# Patient Record
Sex: Female | Born: 1985 | Race: White | Hispanic: No | Marital: Married | Smoking: Current every day smoker
Health system: Southern US, Community
[De-identification: ages and names within clinical notes are randomized; demographics above are authoritative.]

## PROBLEM LIST (undated history)

## (undated) DIAGNOSIS — R102 Pelvic and perineal pain: Secondary | ICD-10-CM

## (undated) DIAGNOSIS — Z87442 Personal history of urinary calculi: Secondary | ICD-10-CM

## (undated) DIAGNOSIS — N83201 Unspecified ovarian cyst, right side: Secondary | ICD-10-CM

## (undated) DIAGNOSIS — N200 Calculus of kidney: Secondary | ICD-10-CM

## (undated) DIAGNOSIS — Z973 Presence of spectacles and contact lenses: Secondary | ICD-10-CM

## (undated) DIAGNOSIS — K219 Gastro-esophageal reflux disease without esophagitis: Secondary | ICD-10-CM

## (undated) HISTORY — PX: APPENDECTOMY: SHX54

---

## 2014-06-27 ENCOUNTER — Encounter (HOSPITAL_COMMUNITY): Payer: Self-pay | Admitting: Emergency Medicine

## 2014-06-27 ENCOUNTER — Emergency Department (INDEPENDENT_AMBULATORY_CARE_PROVIDER_SITE_OTHER)
Admission: EM | Admit: 2014-06-27 | Discharge: 2014-06-27 | Disposition: A | Discharge status: Home / Self Care | Payer: BC Managed Care – PPO | Source: Home / Self Care | Attending: Emergency Medicine | Admitting: Emergency Medicine

## 2014-06-27 DIAGNOSIS — B86 Scabies: Secondary | ICD-10-CM

## 2014-06-27 MED ORDER — PERMETHRIN 5 % EX CREA: TOPICAL_CREAM | CUTANEOUS | Status: DC

## 2014-06-27 MED ORDER — HYDROXYZINE HCL 25 MG PO TABS: 25.0000 mg | ORAL_TABLET | Freq: Three times a day (TID) | ORAL | Status: DC | PRN

## 2014-06-27 MED ORDER — PREDNISONE 10 MG PO TABS: ORAL_TABLET | ORAL | Status: DC

## 2014-06-27 NOTE — Discharge Instructions (Signed)
Scabies is a rash caused by infestation with the mite Sarcoptes scabiei, a 0.3 mm mite that can burrow and deposit eggs in the the surface layer of the skin.  It is transmitted from other infected persons and often the entire family is infested even though they may not have symptoms.  The most common symptom is a very itchy rash.  Scabies can be treated by applying a cream with a medication call Permethrin.  An oral medication called Ivermectin is also available. ° °Here are the the instructions for treatment of scabies.  It is important to remember that all household members should be treated twice.  Once now, and once in 2 weeks. ° °· On the first night, shower, then apply the Elimite cream from head to toe.  This means on the scalp, face, armpits, hands and wrists, under the breasts, the naval, the genital area, the cleft between the buttocks, the feet and between the toes--everywhere on the body.  Do not miss even 1 square inch. °· Leave the cream on overnight, at least 8 hours. °· The next morning, shower again and scrub the Elimite off completely.  Clip the nails short and scrub under the nails with a toothbrush, since the mites can often live under the nails, then be transmitted to other parts of the body.  °· After that, strip the bed and wash sheets, pillow cases, pajamas, underwear and anything that has come in contact with your body in the past month in hot water.   °· Spray your matresses, chairs, couch, and furniture with RID spray which can be gotten over the counter at the drug store. °· Repeat this entire process in 1 week. ° °After the 2 applications, all the mites on your body should be dead.  It will take a while for the itching to go away, sometimes as much as a month.  The skin must rid itself of all the dead mites, eggs, and excreta.  You can use antihistamines such as Benadryl until this happens.  If the itching is severe, cortisone derivatives may help. ° °If the itching persists, it may be  that the rash is caused by something else, that your were reinfected or the Permethrin did not work in which case it would be reasonable to try the oral pill Ivermectin.  If you still have a rash or itching in 1 month, return to the Urgent Care Center or your primary care doctor for a recheck. ° ° °Scabies °Scabies are small bugs (mites) that burrow under the skin and cause red bumps and severe itching. These bugs can only be seen with a microscope. Scabies are highly contagious. They can spread easily from person to person by direct contact. They are also spread through sharing clothing or linens that have the scabies mites living in them. It is not unusual for an entire family to become infected through shared towels, clothing, or bedding.  °HOME CARE INSTRUCTIONS  °· Your caregiver may prescribe a cream or lotion to kill the mites. If cream is prescribed, massage the cream into the entire body from the neck to the bottom of both feet. Also massage the cream into the scalp and face if your child is less than 1 year old. Avoid the eyes and mouth. Do not wash your hands after application. °· Leave the cream on for 8 to 12 hours. Your child should bathe or shower after the 8 to 12 hour application period. Sometimes it is helpful to apply the   cream to your child right before bedtime. °· One treatment is usually effective and will eliminate approximately 95% of infestations. For severe cases, your caregiver may decide to repeat the treatment in 1 week. Everyone in your household should be treated with one application of the cream. °· New rashes or burrows should not appear within 24 to 48 hours after successful treatment. However, the itching and rash may last for 2 to 4 weeks after successful treatment. Your caregiver may prescribe a medicine to help with the itching or to help the rash go away more quickly. °· Scabies can live on clothing or linens for up to 3 days. All of your child's recently used clothing, towels,  stuffed toys, and bed linens should be washed in hot water and then dried in a dryer for at least 20 minutes on high heat. Items that cannot be washed should be enclosed in a plastic bag for at least 3 days. °· To help relieve itching, bathe your child in a cool bath or apply cool washcloths to the affected areas. °· Your child may return to school after treatment with the prescribed cream. °SEEK MEDICAL CARE IF:  °· The itching persists longer than 4 weeks after treatment. °· The rash spreads or becomes infected. Signs of infection include red blisters or yellow-tan crust. °Document Released: 11/08/2005 Document Revised: 01/31/2012 Document Reviewed: 03/19/2009 °[employer]® Patient Information ©2015 [employer]. This information is not intended to replace advice given to you by your health care provider. Make sure you discuss any questions you have with your health care provider. ° °

## 2014-06-27 NOTE — ED Notes (Signed)
Pt    Reports      Symptoms       Of     Rash  between  Fingers       With  Symptoms     For   Several  Months  Getting  Worse   unreleived by cortisone  Cream          No  Angioedema        Appears  In no  Acute  Distress     Pt  Speaking in  Complete  sentances

## 2014-06-27 NOTE — ED Provider Notes (Signed)
  Chief Complaint    Chief Complaint  Patient presents with  . Rash    History of Present Illness      Emily Gallegos is a 28 year old female who has had a 2 month history of a pruritic rash on her hands, wrists, forearms, medial thighs, and buttocks. Her partner has a similar rash, although much less severe. 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 She is unsure whether any of the other residents or employees have a similar rash.  Review of Systems   Other than as noted above, the patient denies any of the following symptoms: Systemic:  No fever or chills. ENT:  No nasal congestion, rhinorrhea, sore throat, swelling of lips, tongue or throat. Resp:  No cough, wheezing, or shortness of breath.  PMFSH    Past medical history, family history, social history, meds, and allergies were reviewed.   Physical Exam     Vital signs:  BP 128/72  Pulse 78  Temp(Src) 98.6 F (37 C) (Oral)  Resp 16  SpO2 100%  LMP 06/21/2014 Gen:  Alert, oriented, in no distress. ENT:  Pharynx clear, no intraoral lesions, moist mucous membranes. Lungs:  Clear to auscultation. Skin:  She has widely scattered, small papules on the hands, in the web spaces between the fingers, wrists, forearms, the left buttock, and the medial thighs.  Assessment    The encounter diagnosis was Scabies.  Plan     1.  Meds:  The following meds were prescribed:   New Prescriptions   HYDROXYZINE (ATARAX/VISTARIL) 25 MG TABLET    Take 1 tablet (25 mg total) by mouth every 8 (eight) hours as needed for itching.   PERMETHRIN (ELIMITE) 5 % CREAM    Apply head to toe at bedtime. Leave on for 8 hours.  Scrub off next morning.  Repeat procedure in 1 week.   PREDNISONE (DELTASONE) 10 MG TABLET    Take 4 tabs daily for 4 days, 3 tabs daily for 4 days, 2 tabs daily for 4 days, then 1 tab daily for 4 days.    2.  Patient Education/Counseling:  The patient was given appropriate handouts, self care instructions, and instructed in  symptomatic relief.  The patient was instructed for both her and her partner to do the treatment simultaneously, then to launder all bed clothes, spray all surfaces in their apartment, and to repeat the treatment again in one week. I suggested that she inform her superiors at the nursing home. It's most likely that she caught the infection there, and at the entire nursing home will need to be treated.  3.  Follow up:  The patient was told to follow up here if no better in 3 to 4 days, or sooner if becoming worse in any way, and given some red flag symptoms such as worsening rash, fever, or difficulty breathing which would prompt immediate return.  Follow up here if necessary.      Reuben Likesavid C Aerin Delany, MD 06/27/14 [phone]

## 2014-07-22 ENCOUNTER — Encounter (HOSPITAL_COMMUNITY): Payer: Self-pay | Admitting: Emergency Medicine

## 2014-07-22 ENCOUNTER — Emergency Department (INDEPENDENT_AMBULATORY_CARE_PROVIDER_SITE_OTHER)
Admission: EM | Admit: 2014-07-22 | Discharge: 2014-07-22 | Disposition: A | Discharge status: Home / Self Care | Payer: BC Managed Care – PPO | Source: Home / Self Care

## 2014-07-22 DIAGNOSIS — H6983 Other specified disorders of Eustachian tube, bilateral: Secondary | ICD-10-CM

## 2014-07-22 DIAGNOSIS — J029 Acute pharyngitis, unspecified: Secondary | ICD-10-CM

## 2014-07-22 DIAGNOSIS — H6980 Other specified disorders of Eustachian tube, unspecified ear: Secondary | ICD-10-CM

## 2014-07-22 DIAGNOSIS — J3089 Other allergic rhinitis: Secondary | ICD-10-CM

## 2014-07-22 LAB — POCT RAPID STREP A: Streptococcus, Group A Screen (Direct): NEGATIVE

## 2014-07-22 NOTE — ED Provider Notes (Signed)
CSN: [id number]     Arrival date & time 07/22/14  4098 History   First MD Initiated Contact with Patient 07/22/14 1031     Chief Complaint  Patient presents with  . URI   (Consider location/radiation/quality/duration/timing/severity/associated sxs/prior Treatment) Patient is a 28 y.o. female presenting with URI and pharyngitis.  URI Presenting symptoms: cough, fever and sore throat   Presenting symptoms: no congestion, no fatigue and no rhinorrhea   Associated symptoms: no headaches and no neck pain   Sore Throat This is a new problem. The current episode started 2 days ago. The problem occurs constantly. The problem has been gradually worsening. Pertinent negatives include no chest pain, no abdominal pain, no headaches and no shortness of breath. The symptoms are aggravated by coughing. The symptoms are relieved by lying down. She has tried acetaminophen for the symptoms. The treatment provided no relief.    History reviewed. No pertinent past medical history. History reviewed. No pertinent past surgical history. No family history on file. History  Substance Use Topics  . Smoking status: Current Every Day Smoker -- 0.50 packs/day    Types: Cigarettes  . Smokeless tobacco: Not on file  . Alcohol Use: Yes   OB History   Grav Para Term Preterm Abortions TAB SAB Ect Mult Living                 Review of Systems  Constitutional: Positive for fever. Negative for chills, activity change, appetite change and fatigue.       Reports 99.0  HENT: Positive for postnasal drip and sore throat. Negative for congestion, facial swelling and rhinorrhea.   Eyes: Negative.   Respiratory: Positive for cough. Negative for shortness of breath.   Cardiovascular: Negative.  Negative for chest pain.  Gastrointestinal: Negative for abdominal pain.  Musculoskeletal: Negative for neck pain and neck stiffness.  Skin: Negative for pallor and rash.  Neurological: Negative.  Negative for headaches.     Allergies  Review of patient's allergies indicates no known allergies.  Home Medications   Prior to Admission medications   Medication Sig Start Date End Date Taking? Authorizing Provider  hydrOXYzine (ATARAX/VISTARIL) 25 MG tablet Take 1 tablet (25 mg total) by mouth every 8 (eight) hours as needed for itching. 06/27/14   Reuben Likes, MD  permethrin (ELIMITE) 5 % cream Apply head to toe at bedtime. Leave on for 8 hours.  Scrub off next morning.  Repeat procedure in 1 week. 06/27/14   Reuben Likes, MD  predniSONE (DELTASONE) 10 MG tablet Take 4 tabs daily for 4 days, 3 tabs daily for 4 days, 2 tabs daily for 4 days, then 1 tab daily for 4 days. 06/27/14   Reuben Likes, MD   BP 118/76  Pulse 88  Temp(Src) 98.8 F (37.1 C) (Oral)  Resp 16  SpO2 99%  LMP 07/01/2014 Physical Exam  Nursing note and vitals reviewed. Constitutional: She is oriented to person, place, and time. She appears well-developed and well-nourished. No distress.  HENT:  Mouth/Throat: No oropharyngeal exudate.  Bilat TM's with retraction. No erythema, effusion or bulging. OP with prominent cobblestoning, clear PND.  Eyes: Conjunctivae and EOM are normal.  Neck: Normal range of motion. Neck supple.  Pulmonary/Chest: Effort normal and breath sounds normal. No respiratory distress. She has no wheezes.  Abdominal: Soft. There is no tenderness.  Musculoskeletal: She exhibits no edema.  Lymphadenopathy:    She has no cervical adenopathy.  Neurological: She is alert and oriented to  person, place, and time. She exhibits normal muscle tone.  Skin: Skin is warm and dry.  Toes with onychomycosis and heels with calluses, dry skin.    ED Course  Procedures (including critical care time) Labs Review Labs Reviewed  POCT RAPID STREP A ([facility] ONLY)   Results for orders placed during the hospital encounter of 07/22/14  POCT RAPID STREP A ([facility] ONLY)      Result Value Ref Range   Streptococcus, Group A  Screen (Direct) NEGATIVE  NEGATIVE    Imaging Review No results found.   MDM   1. Other allergic rhinitis   2. Allergic pharyngitis   3. ETD (eustachian tube dysfunction), bilateral    Claritin or Allegra 180 mg a day Sudafed PE 10 mg for congestion Robitussin DM Flonase nasal spray Cepacol lozenges and Ibuprofen     Hayden Rasmussen, NP 07/22/14 1052

## 2014-07-22 NOTE — ED Provider Notes (Signed)
Medical screening examination/treatment/procedure(s) were performed by non-physician practitioner and as supervising physician I was immediately available for consultation/collaboration.  Leslee Home, M.D.  Reuben Likes, MD 07/22/14 [phone]

## 2014-07-22 NOTE — Discharge Instructions (Signed)
Allergic Rhinitis Claritin or Allegra 180 mg a day Sudafed PE 10 mg for congestion Robitussin DM Flonase nasal spray Cepacol lozenges and Ibuprofen Allergic rhinitis is when the mucous membranes in the nose respond to allergens. Allergens are particles in the air that cause your body to have an allergic reaction. This causes you to release allergic antibodies. Through a chain of events, these eventually cause you to release histamine into the blood stream. Although meant to protect the body, it is this release of histamine that causes your discomfort, such as frequent sneezing, congestion, and an itchy, runny nose.  CAUSES  Seasonal allergic rhinitis (hay fever) is caused by pollen allergens that may come from grasses, trees, and weeds. Year-round allergic rhinitis (perennial allergic rhinitis) is caused by allergens such as house dust mites, pet dander, and mold spores.  SYMPTOMS   Nasal stuffiness (congestion).  Itchy, runny nose with sneezing and tearing of the eyes. DIAGNOSIS  Your health care provider can help you determine the allergen or allergens that trigger your symptoms. If you and your health care provider are unable to determine the allergen, skin or blood testing may be used. TREATMENT  Allergic rhinitis does not have a cure, but it can be controlled by:  Medicines and allergy shots (immunotherapy).  Avoiding the allergen. Hay fever may often be treated with antihistamines in pill or nasal spray forms. Antihistamines block the effects of histamine. There are over-the-counter medicines that may help with nasal congestion and swelling around the eyes. Check with your health care provider before taking or giving this medicine.  If avoiding the allergen or the medicine prescribed do not work, there are many new medicines your health care provider can prescribe. Stronger medicine may be used if initial measures are ineffective. Desensitizing injections can be used if medicine and  avoidance does not work. Desensitization is when a patient is given ongoing shots until the body becomes less sensitive to the allergen. Make sure you follow up with your health care provider if problems continue. HOME CARE INSTRUCTIONS It is not possible to completely avoid allergens, but you can reduce your symptoms by taking steps to limit your exposure to them. It helps to know exactly what you are allergic to so that you can avoid your specific triggers. SEEK MEDICAL CARE IF:   You have a fever.  You develop a cough that does not stop easily (persistent).  You have shortness of breath.  You start wheezing.  Symptoms interfere with normal daily activities. Document Released: 08/03/2001 Document Revised: 11/13/2013 Document Reviewed: 07/16/2013 [facility] Patient Information 2015 [place], [place]. This information is not intended to replace advice given to you by your health care provider. Make sure you discuss any questions you have with your health care provider.  Sore Throat A sore throat is pain, burning, irritation, or scratchiness of the throat. There is often pain or tenderness when swallowing or talking. A sore throat may be accompanied by other symptoms, such as coughing, sneezing, fever, and swollen neck glands. A sore throat is often the first sign of another sickness, such as a cold, flu, strep throat, or mononucleosis (commonly known as mono). Most sore throats go away without medical treatment. CAUSES  The most common causes of a sore throat include:  A viral infection, such as a cold, flu, or mono.  A bacterial infection, such as strep throat, tonsillitis, or whooping cough.  Seasonal allergies.  Dryness in the air.  Irritants, such as smoke or pollution.  Gastroesophageal reflux  disease (GERD). HOME CARE INSTRUCTIONS   Only take over-the-counter medicines as directed by your caregiver.  Drink enough fluids to keep your urine clear or pale yellow.  Rest as  needed.  Try using throat sprays, lozenges, or sucking on hard candy to ease any pain (if older than 4 years or as directed).  Sip warm liquids, such as broth, herbal tea, or warm water with honey to relieve pain temporarily. You may also eat or drink cold or frozen liquids such as frozen ice pops.  Gargle with salt water (mix 1 tsp salt with 8 oz of water).  Do not smoke and avoid secondhand smoke.  Put a cool-mist humidifier in your bedroom at night to moisten the air. You can also turn on a hot shower and sit in the bathroom with the door closed for 5-10 minutes. SEEK IMMEDIATE MEDICAL CARE IF:  You have difficulty breathing.  You are unable to swallow fluids, soft foods, or your saliva.  You have increased swelling in the throat.  Your sore throat does not get better in 7 days.  You have nausea and vomiting.  You have a fever or persistent symptoms for more than 2-3 days.  You have a fever and your symptoms suddenly get worse. MAKE SURE YOU:   Understand these instructions.  Will watch your condition.  Will get help right away if you are not doing well or get worse. Document Released: 12/16/2004 Document Revised: 10/25/2012 Document Reviewed: 07/16/2012 [facility] Patient Information 2015 [place], [place]. This information is not intended to replace advice given to you by your health care provider. Make sure you discuss any questions you have with your health care provider.

## 2014-07-22 NOTE — ED Notes (Signed)
Patient c/o sx including sore throat, productive cough with green phlegm, chest and nasal congestion and low grade fever x 2 days.  Has taken tylenol and ibuprofen with no relief. Patient is alert and oriented and in no acute distress.

## 2014-07-24 LAB — CULTURE, GROUP A STREP

## 2015-03-23 ENCOUNTER — Emergency Department (HOSPITAL_COMMUNITY)
Admission: EM | Admit: 2015-03-23 | Discharge: 2015-03-23 | Disposition: A | Discharge status: Home / Self Care | Payer: BLUE CROSS/BLUE SHIELD | Source: Home / Self Care | Attending: Family Medicine | Admitting: Family Medicine

## 2015-03-23 ENCOUNTER — Encounter (HOSPITAL_COMMUNITY): Payer: Self-pay | Admitting: Emergency Medicine

## 2015-03-23 ENCOUNTER — Emergency Department (INDEPENDENT_AMBULATORY_CARE_PROVIDER_SITE_OTHER): Payer: BLUE CROSS/BLUE SHIELD

## 2015-03-23 DIAGNOSIS — J45901 Unspecified asthma with (acute) exacerbation: Secondary | ICD-10-CM

## 2015-03-23 MED ORDER — IPRATROPIUM-ALBUTEROL 0.5-2.5 (3) MG/3ML IN SOLN
RESPIRATORY_TRACT | Status: AC
  Filled 2015-03-23: qty 3

## 2015-03-23 MED ORDER — ALBUTEROL SULFATE (2.5 MG/3ML) 0.083% IN NEBU
5.0000 mg | INHALATION_SOLUTION | Freq: Once | RESPIRATORY_TRACT | Status: AC
Start: 2015-03-23 — End: 2015-03-23
  Administered 2015-03-23: 5 mg via RESPIRATORY_TRACT

## 2015-03-23 MED ORDER — METHYLPREDNISOLONE SODIUM SUCC 125 MG IJ SOLR
INTRAMUSCULAR | Status: AC
  Filled 2015-03-23: qty 2

## 2015-03-23 MED ORDER — METHYLPREDNISOLONE SODIUM SUCC 125 MG IJ SOLR
125.0000 mg | Freq: Once | INTRAMUSCULAR | Status: AC
  Administered 2015-03-23: 125 mg via INTRAMUSCULAR

## 2015-03-23 MED ORDER — DOXYCYCLINE HYCLATE 100 MG PO CAPS: 100.0000 mg | ORAL_CAPSULE | Freq: Two times a day (BID) | ORAL | Status: DC

## 2015-03-23 MED ORDER — ALBUTEROL SULFATE (2.5 MG/3ML) 0.083% IN NEBU
INHALATION_SOLUTION | RESPIRATORY_TRACT | Status: AC
  Filled 2015-03-23: qty 6

## 2015-03-23 MED ORDER — IPRATROPIUM BROMIDE 0.02 % IN SOLN
0.5000 mg | Freq: Once | RESPIRATORY_TRACT | Status: AC
  Administered 2015-03-23: 0.5 mg via RESPIRATORY_TRACT

## 2015-03-23 NOTE — ED Provider Notes (Signed)
CSN: [id number]     Arrival date & time 03/23/15  1438 History   First MD Initiated Contact with Patient 03/23/15 1529     No chief complaint on file.  (Consider location/radiation/quality/duration/timing/severity/associated sxs/prior Treatment) Patient is a 29 y.o. female presenting with cough. The history is provided by the patient.  Cough Cough characteristics:  Non-productive and dry Severity:  Moderate Onset quality:  Gradual Duration:  3 days Progression:  Worsening Chronicity:  New Smoker: yes   Context: smoke exposure   Relieved by:  None tried Worsened by:  Nothing tried Ineffective treatments:  None tried Associated symptoms: fever, rhinorrhea and wheezing   Associated symptoms: no shortness of breath     History reviewed. No pertinent past medical history. History reviewed. No pertinent past surgical history. No family history on file. History  Substance Use Topics  . Smoking status: Current Every Day Smoker -- 0.50 packs/day    Types: Cigarettes  . Smokeless tobacco: Not on file  . Alcohol Use: Yes   OB History    No data available     Review of Systems  Constitutional: Positive for fever.  HENT: Positive for congestion, postnasal drip and rhinorrhea.   Respiratory: Positive for cough and wheezing. Negative for shortness of breath.   Cardiovascular: Negative.   Gastrointestinal: Negative.     Allergies  Review of patient's allergies indicates no known allergies.  Home Medications   Prior to Admission medications   Medication Sig Start Date End Date Taking? Authorizing Provider  acetaminophen (TYLENOL) 325 MG tablet Take 650 mg by mouth every 6 (six) hours as needed.   Yes Historical Provider, MD  guaifenesin (ROBITUSSIN) 100 MG/5ML syrup Take 200 mg by mouth 3 (three) times daily as needed for cough.   Yes Historical Provider, MD  ibuprofen (ADVIL,MOTRIN) 200 MG tablet Take 200 mg by mouth every 6 (six) hours as needed.   Yes Historical Provider, MD   doxycycline (VIBRAMYCIN) 100 MG capsule Take 1 capsule (100 mg total) by mouth 2 (two) times daily. 03/23/15   Linna HoffJames D Kareen Hitsman, MD  hydrOXYzine (ATARAX/VISTARIL) 25 MG tablet Take 1 tablet (25 mg total) by mouth every 8 (eight) hours as needed for itching. Patient not taking: Reported on 03/23/2015 06/27/14   Reuben Likesavid C Keller, MD  permethrin (ELIMITE) 5 % cream Apply head to toe at bedtime. Leave on for 8 hours.  Scrub off next morning.  Repeat procedure in 1 week. Patient not taking: Reported on 03/23/2015 06/27/14   Reuben Likesavid C Keller, MD  predniSONE (DELTASONE) 10 MG tablet Take 4 tabs daily for 4 days, 3 tabs daily for 4 days, 2 tabs daily for 4 days, then 1 tab daily for 4 days. Patient not taking: Reported on 03/23/2015 06/27/14   Reuben Likesavid C Keller, MD   BP 117/69 mmHg  Pulse 91  Temp(Src) 98.4 F (36.9 C) (Oral)  Resp 18  SpO2 96%  LMP 02/27/2015 Physical Exam  Constitutional: She is oriented to person, place, and time. She appears well-developed and well-nourished.  HENT:  Head: Normocephalic.  Right Ear: External ear normal.  Left Ear: External ear normal.  Nose: Mucosal edema and rhinorrhea present.  Mouth/Throat: Oropharynx is clear and moist. No oropharyngeal exudate.  Eyes: Conjunctivae are normal. Pupils are equal, round, and reactive to light.  Neck: Normal range of motion. Neck supple.  Cardiovascular: Normal rate, normal heart sounds and intact distal pulses.   Pulmonary/Chest: Effort normal. She has wheezes. She has no rales.  Lymphadenopathy:  She has no cervical adenopathy.  Neurological: She is alert and oriented to person, place, and time.  Skin: Skin is warm and dry.  Nursing note and vitals reviewed.   ED Course  Procedures (including critical care time) Labs Review Labs Reviewed - No data to display  Imaging Review Dg Chest 2 View  03/23/2015   CLINICAL DATA:  Cough and shortness of breath for 2-3 days.  EXAM: CHEST  2 VIEW  COMPARISON:  None.  FINDINGS: The heart size and  mediastinal contours are within normal limits. Both lungs are clear. The visualized skeletal structures are unremarkable.  IMPRESSION: No active cardiopulmonary disease.   Electronically Signed   By: Norva Pavlov M.D.   On: 03/23/2015 15:55    X-rays reviewed and report per radiologist.  MDM   1. Asthmatic bronchitis with acute exacerbation    Sx improved and lungs clear after neb at time of d/c.    Linna Hoff, MD 03/23/15 [phone]

## 2015-03-23 NOTE — Discharge Instructions (Signed)
Take all of medicine, drink lots of fluids, no more smoking, see your doctor if further problems  °

## 2015-11-04 ENCOUNTER — Emergency Department (HOSPITAL_COMMUNITY)
Admission: EM | Admit: 2015-11-04 | Discharge: 2015-11-04 | Disposition: A | Discharge status: Home / Self Care | Payer: BLUE CROSS/BLUE SHIELD | Attending: Emergency Medicine | Admitting: Emergency Medicine

## 2015-11-04 ENCOUNTER — Emergency Department (HOSPITAL_COMMUNITY): Payer: BLUE CROSS/BLUE SHIELD

## 2015-11-04 ENCOUNTER — Encounter (HOSPITAL_COMMUNITY): Payer: Self-pay | Admitting: Nurse Practitioner

## 2015-11-04 DIAGNOSIS — F17210 Nicotine dependence, cigarettes, uncomplicated: Secondary | ICD-10-CM | POA: Insufficient documentation

## 2015-11-04 DIAGNOSIS — Z79899 Other long term (current) drug therapy: Secondary | ICD-10-CM | POA: Insufficient documentation

## 2015-11-04 DIAGNOSIS — H538 Other visual disturbances: Secondary | ICD-10-CM | POA: Diagnosis not present

## 2015-11-04 DIAGNOSIS — L03213 Periorbital cellulitis: Secondary | ICD-10-CM

## 2015-11-04 DIAGNOSIS — M542 Cervicalgia: Secondary | ICD-10-CM | POA: Diagnosis not present

## 2015-11-04 DIAGNOSIS — R51 Headache: Secondary | ICD-10-CM | POA: Diagnosis not present

## 2015-11-04 DIAGNOSIS — R220 Localized swelling, mass and lump, head: Secondary | ICD-10-CM | POA: Diagnosis present

## 2015-11-04 DIAGNOSIS — Z792 Long term (current) use of antibiotics: Secondary | ICD-10-CM | POA: Diagnosis not present

## 2015-11-04 DIAGNOSIS — R52 Pain, unspecified: Secondary | ICD-10-CM

## 2015-11-04 DIAGNOSIS — J3489 Other specified disorders of nose and nasal sinuses: Secondary | ICD-10-CM | POA: Insufficient documentation

## 2015-11-04 DIAGNOSIS — H05012 Cellulitis of left orbit: Secondary | ICD-10-CM | POA: Diagnosis not present

## 2015-11-04 LAB — BASIC METABOLIC PANEL
ANION GAP: 7 (ref 5–15)
BUN: 11 mg/dL (ref 6–20)
CALCIUM: 9.5 mg/dL (ref 8.9–10.3)
CO2: 25 mmol/L (ref 22–32)
CREATININE: 0.91 mg/dL (ref 0.44–1.00)
Chloride: 108 mmol/L (ref 101–111)
GFR calc Af Amer: >60 mL/min (ref >60)
GLUCOSE: 95 mg/dL (ref 65–99)
Potassium: 4 mmol/L (ref 3.5–5.1)
Sodium: 140 mmol/L (ref 135–145)

## 2015-11-04 LAB — CBC WITH DIFFERENTIAL/PLATELET
BASOS ABS: 0 10*3/uL (ref 0.0–0.1)
BASOS PCT: 1 %
EOS ABS: 0.3 10*3/uL (ref 0.0–0.7)
EOS PCT: 4 %
HEMATOCRIT: 39.7 % (ref 36.0–46.0)
Hemoglobin: 12.8 g/dL (ref 12.0–15.0)
Lymphocytes Relative: 38 %
Lymphs Abs: 2.8 10*3/uL (ref 0.7–4.0)
MCH: 30.9 pg (ref 26.0–34.0)
MCHC: 32.2 g/dL (ref 30.0–36.0)
MCV: 95.9 fL (ref 78.0–100.0)
MONO ABS: 0.9 10*3/uL (ref 0.1–1.0)
MONOS PCT: 12 %
NEUTROS ABS: 3.4 10*3/uL (ref 1.7–7.7)
Neutrophils Relative %: 45 %
PLATELETS: 244 10*3/uL (ref 150–400)
RBC: 4.14 MIL/uL (ref 3.87–5.11)
RDW: 12.6 % (ref 11.5–15.5)
WBC: 7.4 10*3/uL (ref 4.0–10.5)

## 2015-11-04 MED ORDER — DEXTROSE 5 % IV SOLN
1.0000 g | Freq: Once | INTRAVENOUS | Status: AC
  Administered 2015-11-04: 1 g via INTRAVENOUS
  Filled 2015-11-04: qty 10

## 2015-11-04 MED ORDER — OXYCODONE-ACETAMINOPHEN 5-325 MG PO TABS
1.0000 | ORAL_TABLET | Freq: Once | ORAL | Status: AC
  Administered 2015-11-04: 1 via ORAL
  Filled 2015-11-04: qty 1

## 2015-11-04 MED ORDER — SODIUM CHLORIDE 0.9 % IV BOLUS (SEPSIS)
500.0000 mL | Freq: Once | INTRAVENOUS | Status: AC
  Administered 2015-11-04: 500 mL via INTRAVENOUS

## 2015-11-04 NOTE — ED Provider Notes (Signed)
  Face-to-face evaluation   History: She is being treated for left orbital cellulitis. She's not had any improvement and still complains of left eye discomfort, and swelling. She also feels her left nose is congested. She denies fever, chills, vomiting, or weakness. He was told to come to the ED for IV antibiotics if she did not get better with the prescribed treatment.  Physical exam: Alert, calm, cooperative. She appears comfortable. Left periorbital swelling, without significant erythema. Left eye has mild injection of the conjunctiva. There is no drainage from the left eye. External ocular muscles are intact.  Medical screening examination/treatment/procedure(s) were conducted as a shared visit with resident and myself.  I personally evaluated the patient during the encounter  Mancel BaleElliott Lindzie Boxx, MD 11/04/15 1807

## 2015-11-04 NOTE — ED Notes (Signed)
She was dx with cellulitis to L eye buy opthalmologist, being treated with amoxicillin,clindamycin, erythromycin oral and gtts but symptoms are worsening. C/o pain, blurred vision to L eye, redness and swelling is noted around the eye

## 2015-11-04 NOTE — Discharge Instructions (Signed)
Please continue to take your current antibiotics and complete the full 10 day course. Please also continue to use warm/cold compresses every 1-2 hours for 30 minutes at a time. It will take time for your swelling and symptoms to improve.  Your swelling is a preseptal periorbital cellulitis. CT scan did not show any infection in your sinuses or an orbital cellulitis, which means there is no emergent therapy indicated at this time.   Preseptal Cellulitis, Adult Preseptal cellulitis--also called periorbital cellulitis--is an infection that can affect your eyelid and the soft tissues or skin that surround your eye. The infection may also affect the structures that produce and drain your tears. It does not affect your eye itself. CAUSES This condition may be caused by:  Bacterial infection.  Long-term (chronic) sinus infections.  An object (foreign body) that is stuck behind the eye.  An injury that:  Goes through the eyelid tissues.  Causes an infection, such as an insect sting.  Fracture of the bone around the eye.  Infections that have spread from the eyelid or other structures around the eye.  Bite wounds.  Inflammation or infection of the lining membranes of the brain (meningitis).  An infection in the blood (septicemia).  Dental infection (abscess).  Viral infection. This is rare. RISK FACTORS Risk factors for preseptal cellulitis include:  Participating in activities that increase your risk of trauma to the face or head, such as boxing or high-speed activities.  Having a weakened defense system (immune system).  Medical conditions, such as nasal polyps, that increase your risk for frequent or recurrent sinus infections.  Not receiving regular dental care. SYMPTOMS Symptoms of this condition usually come on suddenly. Symptoms may include:  Red, hot, and swollen eyelids.  Fever.  Difficulty opening your eye.  Eye pain. DIAGNOSIS This condition may be diagnosed  by an eye exam. You may also have tests, such as:  Blood tests.  CT scan.  MRI.  Spinal tap (lumbar puncture). This is a procedure that involves removing and examining a small amount of the fluid that surrounds the brain and spinal cord. This checks for meningitis. TREATMENT Treatment for this condition will include antibiotic medicines. These may be given by mouth (orally), through an IV, or as a shot. Your health care provider may also recommend nasal decongestants to reduce swelling. HOME CARE INSTRUCTIONS  Take your antibiotic medicine as directed by your health care provider. Finish all of it even if you start to feel better.  Take medicines only as directed by your health care provider.  Drink enough fluid to keep your urine clear or pale yellow.  Do not use any tobacco products, including cigarettes, chewing tobacco, or electronic cigarettes. If you need help quitting, ask your health care provider.  Keep all follow-up visits as directed by your health care provider. These include any visits with an eye specialist (ophthalmologist) or dentist. SEEK MEDICAL CARE IF:  You have a fever.  Your eyelids become more red, warm, or swollen.  You have new symptoms.  Your symptoms do not get better with treatment. SEEK IMMEDIATE MEDICAL CARE IF:  You develop double vision, or your vision becomes blurred or worsens in any way.  You have trouble moving your eyes.  Your eye looks like it is sticking out or bulging out (proptosis).  You develop a severe headache, severe neck pain, or neck stiffness.  You develop repeated vomiting.   This information is not intended to replace advice given to you by your  health care provider. Make sure you discuss any questions you have with your health care provider.   Document Released: 12/11/2010 Document Revised: 03/25/2015 Document Reviewed: 11/04/2014 Elsevier Interactive Patient Education [employer]

## 2015-11-04 NOTE — ED Notes (Signed)
Pt putting on gown and waiting for MD

## 2015-11-04 NOTE — ED Notes (Signed)
MD Patel at the bedside

## 2015-11-04 NOTE — ED Provider Notes (Signed)
CSN: [id number]     Arrival date & time 11/04/15  1156 History   First MD Initiated Contact with Patient 11/04/15 1424     Chief Complaint  Patient presents with  . Cellulitis    HPI  Ms. Emily Gallegos is a 29 year old female with smoking history of 0.5 PPD who presents with left sided periorbital swelling. Swelling first began 6 days ago. She went to an urgent care and was started on treatment for cellulitis with oral Amoxicillin, Clindamycin, and Erythromycin ointment. She saw no improvement with this treatment and came to the ED. She reports seeing brown drainage from her eye. She has blurry vision in the left eye, but no double vision or visual deficit. She does not have pain with movement of the eye. She describes a throbbing headache behind her left eye, left sided nasal congestion, and soreness of her neck on the left side. She denies fever, chills, nausea, vomiting, chest pain, cough, or SOB. She denies any trauma/injury to the eye or insect bite.  History reviewed. No pertinent past medical history. History reviewed. No pertinent past surgical history. History reviewed. No pertinent family history. Social History  Substance Use Topics  . Smoking status: Current Every Day Smoker -- 0.50 packs/day    Types: Cigarettes  . Smokeless tobacco: None  . Alcohol Use: Yes   OB History    No data available     Review of Systems  Constitutional: Negative for fever and chills.  HENT: Positive for congestion and rhinorrhea.   Eyes: Positive for discharge, redness and itching.       Blurry vision. Swelling of eyelids.  Respiratory: Negative for cough, shortness of breath and wheezing.   Cardiovascular: Negative for chest pain and palpitations.  Gastrointestinal: Negative for nausea, vomiting, abdominal pain, diarrhea and constipation.  Genitourinary: Negative for dysuria and hematuria.  Musculoskeletal: Negative for myalgias and joint swelling.       Soreness of neck on left side.   Neurological: Positive for headaches. Negative for dizziness.      Allergies  Review of patient's allergies indicates no known allergies.  Home Medications   Prior to Admission medications   Medication Sig Start Date End Date Taking? Authorizing Provider  acetaminophen (TYLENOL) 325 MG tablet Take 650 mg by mouth every 6 (six) hours as needed for mild pain.    Yes Historical Provider, MD  amoxicillin-clavulanate (AUGMENTIN) 875-125 MG tablet Take 1 tablet by mouth 2 (two) times daily. 10/31/15  Yes Historical Provider, MD  clindamycin (CLEOCIN) 300 MG capsule Take 300 mg by mouth 3 (three) times daily. 11/02/15  Yes Historical Provider, MD  erythromycin ophthalmic ointment Place 1 application into the left eye once.  11/02/15  Yes Historical Provider, MD  fexofenadine (ALLEGRA) 180 MG tablet Take 180 mg by mouth daily.   Yes Historical Provider, MD  ibuprofen (ADVIL,MOTRIN) 200 MG tablet Take 400 mg by mouth every 6 (six) hours as needed for mild pain.    Yes Historical Provider, MD  PATADAY 0.2 % SOLN Place 1 drop into the left eye daily.  11/03/15  Yes Historical Provider, MD   BP 110/62 mmHg  Pulse 56  Temp(Src) 98.4 F (36.9 C) (Oral)  Resp 16  SpO2 99% Physical Exam  Constitutional: She appears well-developed and well-nourished. No distress.  HENT:  Head: Normocephalic and atraumatic.  Right Ear: Tympanic membrane normal.  Left Ear: Tympanic membrane normal.  Mouth/Throat: Oropharynx is clear and moist. No oropharyngeal exudate.  Eyes:  Periorbital swelling of left eye with erythema, tender to touch. EOMI without pain on eye movement. PERRL. No foreign body or injury to eye observed. Visual fields normal.     ED Course  Procedures (including critical care time) Labs Review Labs Reviewed  BASIC METABOLIC PANEL  CBC WITH DIFFERENTIAL/PLATELET    Imaging Review [facility]  11/04/2015  CLINICAL DATA:  Blurred vision and left side redness and  swelling. EXAM: CT PARANASAL SINUS LIMITED WITHOUT CONTRAST TECHNIQUE: Non-contiguous multidetector CT images of the paranasal sinuses were obtained in a single plane without contrast. COMPARISON:  None. FINDINGS: There is mild to moderate soft tissue swelling around the left globe. This is all preseptal. No intraorbital or intraconal abnormality. The superior ophthalmic veins appear symmetric bilaterally. The optic nerves are normal in symmetric. The extraocular muscles appear symmetric. No obvious lacrimal gland pathology. The paranasal sinuses and mastoid air cells are clear. The middle ear cavities are clear. No neck mass or adenopathy. The parotid and submandibular glands appear normal and symmetric bilaterally. The visualized portion of the brain is unremarkable. The mandibular condyles are normally located. No significant dental disease. IMPRESSION: Preseptal left periorbital cellulitis. Electronically Signed   By: Rudie MeyerP.  Gallerani M.D.   On: 11/04/2015 16:33   I have personally reviewed and evaluated these images and lab results as part of my medical decision-making.   EKG Interpretation None      MDM   Final diagnoses:  Pain  Periorbital cellulitis of left eye  Ms. Emily Gallegos is a 29 year old female with smoking history of 0.5 PPD who presents with left sided periorbital swelling. She has blurry vision, but no diplopia. No discharge seen on exam. EOMI and without pain with movement. Appears to be a periorbital cellulitis. She is currently on 10 day course of oral Amoxicillin and Clindamycin as well as Erythromycin ointment, prescribed 6 days ago. CT maxillofacial ordered to assess for sinusitis.   Imaging returns without evidence for sinusitis. No intraorbital or intraconal abnormality seen. Mild to moderate soft tissue swelling noted around the left globe, read as preseptal left periorbital cellulitis. Discussed with patient that no emergent treatment or change in therapy is needed at this  time. Will discharge patient with advise to continue and complete 10 day course of antibiotics. Patient will also apply warm compresses to the eye every 1-2 hours for 30 minutes at a time and reassured that it may take some time for her swelling to improve. She understands and agrees.     Darreld McleanVishal Aymar Whitfill, MD 11/04/15 1714  Mancel BaleElliott Wentz, MD 11/04/15 [phone]

## 2017-05-02 ENCOUNTER — Ambulatory Visit: Payer: 59 | Admitting: Podiatry

## 2017-05-18 ENCOUNTER — Ambulatory Visit (INDEPENDENT_AMBULATORY_CARE_PROVIDER_SITE_OTHER): Payer: 59 | Admitting: Podiatry

## 2017-05-18 DIAGNOSIS — B351 Tinea unguium: Secondary | ICD-10-CM

## 2017-05-18 DIAGNOSIS — M79676 Pain in unspecified toe(s): Secondary | ICD-10-CM | POA: Diagnosis not present

## 2017-05-18 MED ORDER — TERBINAFINE HCL 250 MG PO TABS: 250.0000 mg | ORAL_TABLET | Freq: Every day | ORAL | 0 refills | Status: DC

## 2017-05-18 MED ORDER — CLOTRIMAZOLE-BETAMETHASONE 1-0.05 % EX CREA: 1.0000 "application " | TOPICAL_CREAM | Freq: Two times a day (BID) | CUTANEOUS | 2 refills | Status: DC

## 2017-05-20 NOTE — Addendum Note (Signed)
Addended by: Felecia ShellingEVANS, Erinn Mendosa M on: 05/20/2017 02:06 PM   Modules accepted: Level of Service

## 2017-05-20 NOTE — Progress Notes (Signed)
   Subjective: Patient presents today for possible treatment and evaluation of fungal nails bilaterally 1 through 5. Patient states that the nails have been discolored and thickened for greater than 1 month. She also reports dry, peeling skin to the plantar aspect of bilateral feet. She has been using OTC antifungal creams and sprays with no significant relief. Patient presents today for further treatment and evaluation.  Objective: Physical Exam General: The patient is alert and oriented x3 in no acute distress.  Dermatology: Hyperkeratotic, discolored, thickened, onychodystrophy of nails noted bilaterally. Pruritus to the interdigital areas of bilateral feet with hyperkeratosis. Skin is warm, dry and supple bilateral lower extremities. Negative for open lesions or macerations.  Vascular: Palpable pedal pulses bilaterally. No edema or erythema noted. Capillary refill within normal limits.  Neurological: Epicritic and protective threshold grossly intact bilaterally.   Musculoskeletal Exam: Range of motion within normal limits to all pedal and ankle joints bilateral. Muscle strength 5/5 in all groups bilateral.   Assessment: #1 onychodystrophy bilateral toenails #2 possible onychomycosis #3 hyperkeratotic nails bilateral #4 tinea pedis bilaterally  Plan of Care:  #1 Patient was evaluated. #2 prescription for Lamisil 250 mg by mouth daily #90 given to patient. #3 prescription for Lotrisone cream given to patient. #4 return to clinic in 4 months.  Felecia ShellingBrent M. Jackelyne Sayer, DPM [facility]  Dr. Felecia ShellingBrent M. Tanajah Boulter, DPM    [address]                                        [place], [place] [id number]                Office [phone]  Fax [phone]

## 2017-07-06 ENCOUNTER — Encounter (HOSPITAL_COMMUNITY): Payer: Self-pay

## 2017-07-06 ENCOUNTER — Emergency Department (HOSPITAL_COMMUNITY): Payer: 59

## 2017-07-06 ENCOUNTER — Emergency Department (HOSPITAL_COMMUNITY)
Admission: EM | Admit: 2017-07-06 | Discharge: 2017-07-06 | Disposition: A | Discharge status: Home / Self Care | Payer: 59 | Attending: Emergency Medicine | Admitting: Emergency Medicine

## 2017-07-06 DIAGNOSIS — N10 Acute tubulo-interstitial nephritis: Secondary | ICD-10-CM | POA: Insufficient documentation

## 2017-07-06 DIAGNOSIS — R110 Nausea: Secondary | ICD-10-CM

## 2017-07-06 DIAGNOSIS — K824 Cholesterolosis of gallbladder: Secondary | ICD-10-CM | POA: Diagnosis not present

## 2017-07-06 DIAGNOSIS — N83201 Unspecified ovarian cyst, right side: Secondary | ICD-10-CM

## 2017-07-06 DIAGNOSIS — R1011 Right upper quadrant pain: Secondary | ICD-10-CM | POA: Diagnosis present

## 2017-07-06 DIAGNOSIS — R350 Frequency of micturition: Secondary | ICD-10-CM | POA: Insufficient documentation

## 2017-07-06 DIAGNOSIS — N12 Tubulo-interstitial nephritis, not specified as acute or chronic: Secondary | ICD-10-CM

## 2017-07-06 DIAGNOSIS — R109 Unspecified abdominal pain: Secondary | ICD-10-CM

## 2017-07-06 DIAGNOSIS — Z79899 Other long term (current) drug therapy: Secondary | ICD-10-CM | POA: Insufficient documentation

## 2017-07-06 DIAGNOSIS — F17210 Nicotine dependence, cigarettes, uncomplicated: Secondary | ICD-10-CM | POA: Diagnosis not present

## 2017-07-06 LAB — CBC
HEMATOCRIT: 40.4 % (ref 36.0–46.0)
HEMOGLOBIN: 13.3 g/dL (ref 12.0–15.0)
MCH: 30.7 pg (ref 26.0–34.0)
MCHC: 32.9 g/dL (ref 30.0–36.0)
MCV: 93.3 fL (ref 78.0–100.0)
Platelets: 317 10*3/uL (ref 150–400)
RBC: 4.33 MIL/uL (ref 3.87–5.11)
RDW: 12.8 % (ref 11.5–15.5)
WBC: 10.1 10*3/uL (ref 4.0–10.5)

## 2017-07-06 LAB — HEPATIC FUNCTION PANEL
ALBUMIN: 3.5 g/dL (ref 3.5–5.0)
ALK PHOS: 59 U/L (ref 38–126)
ALT: 15 U/L (ref 14–54)
AST: 17 U/L (ref 15–41)
Bilirubin, Direct: <0.1 mg/dL — ABNORMAL LOW (ref 0.1–0.5)
TOTAL PROTEIN: 6.8 g/dL (ref 6.5–8.1)
Total Bilirubin: 0.4 mg/dL (ref 0.3–1.2)

## 2017-07-06 LAB — BASIC METABOLIC PANEL
Anion gap: 7 (ref 5–15)
BUN: 9 mg/dL (ref 6–20)
CO2: 22 mmol/L (ref 22–32)
Calcium: 9.1 mg/dL (ref 8.9–10.3)
Chloride: 110 mmol/L (ref 101–111)
Creatinine, Ser: 1.03 mg/dL — ABNORMAL HIGH (ref 0.44–1.00)
GFR calc Af Amer: >60 mL/min (ref >60)
GLUCOSE: 95 mg/dL (ref 65–99)
POTASSIUM: 4 mmol/L (ref 3.5–5.1)
Sodium: 139 mmol/L (ref 135–145)

## 2017-07-06 LAB — I-STAT BETA HCG BLOOD, ED (MC, WL, AP ONLY): I-stat hCG, quantitative: <5 m[IU]/mL (ref <5)

## 2017-07-06 LAB — URINALYSIS, ROUTINE W REFLEX MICROSCOPIC
BILIRUBIN URINE: NEGATIVE
Glucose, UA: NEGATIVE mg/dL
Hgb urine dipstick: NEGATIVE
KETONES UR: NEGATIVE mg/dL
Nitrite: NEGATIVE
Protein, ur: NEGATIVE mg/dL
Specific Gravity, Urine: 1.013 (ref 1.005–1.030)
pH: 5 (ref 5.0–8.0)

## 2017-07-06 LAB — LIPASE, BLOOD: Lipase: 31 U/L (ref 11–51)

## 2017-07-06 MED ORDER — ONDANSETRON 4 MG PO TBDP
4.0000 mg | ORAL_TABLET | Freq: Three times a day (TID) | ORAL | 0 refills | Status: DC | PRN
Start: 2017-07-06 — End: 2018-04-20

## 2017-07-06 MED ORDER — MORPHINE SULFATE (PF) 4 MG/ML IV SOLN
4.0000 mg | Freq: Once | INTRAVENOUS | Status: AC
  Administered 2017-07-06: 4 mg via INTRAVENOUS
  Filled 2017-07-06: qty 1

## 2017-07-06 MED ORDER — NAPROXEN 250 MG PO TABS: 250.0000 mg | ORAL_TABLET | Freq: Two times a day (BID) | ORAL | 0 refills | Status: DC

## 2017-07-06 MED ORDER — CEPHALEXIN 500 MG PO CAPS: 500.0000 mg | ORAL_CAPSULE | Freq: Four times a day (QID) | ORAL | 0 refills | Status: DC

## 2017-07-06 MED ORDER — ONDANSETRON HCL 4 MG/2ML IJ SOLN
4.0000 mg | Freq: Once | INTRAMUSCULAR | Status: AC
  Administered 2017-07-06: 4 mg via INTRAVENOUS
  Filled 2017-07-06: qty 2

## 2017-07-06 MED ORDER — SODIUM CHLORIDE 0.9 % IV BOLUS (SEPSIS)
1000.0000 mL | Freq: Once | INTRAVENOUS | Status: AC
  Administered 2017-07-06: 1000 mL via INTRAVENOUS

## 2017-07-06 MED ORDER — HYDROCODONE-ACETAMINOPHEN 5-325 MG PO TABS: 1.0000 | ORAL_TABLET | Freq: Four times a day (QID) | ORAL | 0 refills | Status: DC | PRN

## 2017-07-06 NOTE — ED Notes (Signed)
[facility] for further evaluation of RUQ pain with concern for galldbladder problems

## 2017-07-06 NOTE — ED Provider Notes (Signed)
[facility]-EMERGENCY DEPT Provider Note   CSN: [id number] Arrival date & time: 07/06/17  1248     History   Chief Complaint Chief Complaint  Patient presents with  . Flank Pain    HPI Emily Gallegos is a 31 y.o. female.  Emily Gallegos is a 31 y.o. Female who presents to the emergency department complaining of intermittent right upper quadrant and right flank pain ongoing for several months. She reports she often will have little spurts of pain that then resolved spontaneously. She reports over the past 2 days she's had more constant pain that seems to be worse when she eats. She denies any nausea or vomiting. No urinary symptoms. No history of kidney stones. Only previous abdominal surgical history includes an appendectomy. No treatments attempted prior to arrival. She does report her pain is worse with eating. She does report 2 episodes of diarrhea yesterday. She denies fevers, nausea, vomiting, urinary symptoms, hematuria, difficulty urinating, chest pain, coughing or shortness of breath.   The history is provided by the patient and medical records. No language interpreter was used.  Flank Pain  Associated symptoms include abdominal pain. Pertinent negatives include no chest pain, no headaches and no shortness of breath.    History reviewed. No pertinent past medical history.  There are no active problems to display for this patient.   History reviewed. No pertinent surgical history.  OB History    No data available       Home Medications    Prior to Admission medications   Medication Sig Start Date End Date Taking? Authorizing Provider  cephALEXin (KEFLEX) 500 MG capsule Take 1 capsule (500 mg total) by mouth 4 (four) times daily. 07/06/17   Everlene Farrier, PA-C  clotrimazole-betamethasone (LOTRISONE) cream Apply 1 application topically 2 (two) times daily. 05/18/17   Felecia Shelling, DPM  HYDROcodone-acetaminophen (NORCO/VICODIN) 5-325 MG tablet Take 1-2 tablets by mouth every  6 (six) hours as needed for moderate pain or severe pain. 07/06/17   Everlene Farrier, PA-C  naproxen (NAPROSYN) 250 MG tablet Take 1 tablet (250 mg total) by mouth 2 (two) times daily with a meal. 07/06/17   Everlene Farrier, PA-C  ondansetron (ZOFRAN ODT) 4 MG disintegrating tablet Take 1 tablet (4 mg total) by mouth every 8 (eight) hours as needed for nausea or vomiting. 07/06/17   Everlene Farrier, PA-C  terbinafine (LAMISIL) 250 MG tablet Take 1 tablet (250 mg total) by mouth daily. 05/18/17   Felecia Shelling, DPM    Family History History reviewed. No pertinent family history.  Social History Social History  Substance Use Topics  . Smoking status: Current Every Day Smoker    Packs/day: 0.50    Types: Cigarettes  . Smokeless tobacco: Not on file  . Alcohol use Yes     Comment: occ     Allergies   Latex   Review of Systems Review of Systems  Constitutional: Negative for chills and fever.  HENT: Negative for congestion and sore throat.   Eyes: Negative for visual disturbance.  Respiratory: Negative for cough and shortness of breath.   Cardiovascular: Negative for chest pain.  Gastrointestinal: Positive for abdominal pain and diarrhea. Negative for abdominal distention, blood in stool, constipation, nausea and vomiting.  Genitourinary: Positive for flank pain, frequency and urgency. Negative for difficulty urinating, dysuria, hematuria, pelvic pain, vaginal bleeding and vaginal discharge.  Musculoskeletal: Negative for back pain and neck pain.  Skin: Negative for rash.  Neurological: Negative for light-headedness and headaches.  Physical Exam Updated Vital Signs BP 120/78   Pulse (!) 54   Temp 98.1 F (36.7 C) (Oral)   Resp 18   Ht 5\' 3"  (1.6 m)   Wt 84.4 kg (186 lb)   LMP 06/29/2017 (Exact Date)   SpO2 98%   BMI 32.95 kg/m   Physical Exam  Constitutional: She appears well-developed and well-nourished. No distress.  Nontoxic appearing.  HENT:  Head:  Normocephalic and atraumatic.  Mouth/Throat: Oropharynx is clear and moist.  Eyes: Pupils are equal, round, and reactive to light. Conjunctivae are normal. Right eye exhibits no discharge. Left eye exhibits no discharge.  Neck: Neck supple.  Cardiovascular: Normal rate, regular rhythm, normal heart sounds and intact distal pulses.  Exam reveals no gallop and no friction rub.   No murmur heard. Pulmonary/Chest: Effort normal and breath sounds normal. No respiratory distress. She has no wheezes. She has no rales.  Abdominal: Soft. Bowel sounds are normal. She exhibits no distension and no mass. There is tenderness. There is no guarding.  Abdomen is soft. Bowel sounds are present. Patient has right upper quadrant abdominal tenderness to palpation. Positive Murphy sign. Patient also has right flank tenderness to palpation.   Musculoskeletal: She exhibits no edema.  Lymphadenopathy:    She has no cervical adenopathy.  Neurological: She is alert. Coordination normal.  Skin: Skin is warm and dry. Capillary refill takes less than 2 seconds. No rash noted. She is not diaphoretic. No erythema. No pallor.  Psychiatric: She has a normal mood and affect. Her behavior is normal.  Nursing note and vitals reviewed.    ED Treatments / Results  Labs (all labs ordered are listed, but only abnormal results are displayed) Labs Reviewed  URINALYSIS, ROUTINE W REFLEX MICROSCOPIC - Abnormal; Notable for the following:       Result Value   APPearance HAZY (*)    Leukocytes, UA LARGE (*)    Bacteria, UA FEW (*)    Squamous Epithelial / LPF 0-5 (*)    All other components within normal limits  BASIC METABOLIC PANEL - Abnormal; Notable for the following:    Creatinine, Ser 1.03 (*)    All other components within normal limits  HEPATIC FUNCTION PANEL - Abnormal; Notable for the following:    Bilirubin, Direct <0.1 (*)    All other components within normal limits  URINE CULTURE  CBC  LIPASE, BLOOD  I-STAT  BETA HCG BLOOD, ED ([facility], [facility], [facility] ONLY)    EKG  EKG Interpretation None       Radiology US Abdomen Limited  Result Date: 07/06/2017 CLINICAL DATA:  Right upper quadrant pain. EXAM: ULTRASOUND ABDOMEN LIMITED RIGHT UPPER QUADRANT COMPARISON:  No recent. FINDINGS: Gallbladder: 4 mm polyp.  No sonographic Murphy sign noted by sonographer. Common bile duct: Diameter: 2.0 mm Liver: Increase echogenicity consistent with fatty infiltration and/or hepatocellular disease. No focal hepatic abnormality identified. IMPRESSION: 1. 4 mm gallbladder polyp.  No biliary distention. 2. Increased hepatic echogenicity consistent fatty infiltration and/or hepatocellular disease. No focal hepatic abnormality identified . Electronically Signed   By: Maisie Fus  Register   On: 07/06/2017 16:30   Ct Renal Stone Study  Result Date: 07/06/2017 CLINICAL DATA:  Right flank pain. EXAM: CT ABDOMEN AND PELVIS WITHOUT CONTRAST TECHNIQUE: Multidetector CT imaging of the abdomen and pelvis was performed following the standard protocol without IV contrast. COMPARISON:  Right upper quadrant ultrasound earlier this day. FINDINGS: Lower chest: Mild hypoventilatory atelectasis at the lung bases. Hepatobiliary: No focal hepatic  lesion. Gallbladder physiologically distended, no calcified stone. No biliary dilatation. Pancreas: No ductal dilatation or inflammation. Spleen: Normal in size without focal abnormality. Adrenals/Urinary Tract: Normal adrenal glands. Mild right hydronephrosis and perinephric edema. Punctate nonobstructing stones in the lower right kidney. No ureteral calculi. No left hydronephrosis. Punctate nonobstructing stone in the mid left kidney. Urinary bladder is minimally distended. No bladder stone or wall thickening. Stomach/Bowel: Stomach is within normal limits. Appendix is not visualized, no pericecal or right lower quadrant inflammation. No evidence of bowel wall thickening, distention, or inflammatory changes.  Vascular/Lymphatic: Normal caliber abdominal aorta. Small mesenteric lymph nodes, not enlarged by size criteria. No pathologic abdominopelvic adenopathy. Reproductive: 5 cm cyst in the right adnexa. Left ovary not confidently visualized. Uterus grossly normal in size for noncontrast evaluation. Other: No free air, free fluid, or intra-abdominal fluid collection. Musculoskeletal: There are no acute or suspicious osseous abnormalities. IMPRESSION: 1. Mild right hydronephrosis and perinephric edema without obstructing stone. Findings may reflect a recently passed stone versus pyelonephritis. 2. Punctate nonobstructing calculi in both kidneys. 3. Right adnexal cyst measures 5 cm. Given size, recommend pelvic ultrasound follow-up in 6-12 weeks. This recommendation follows ACR consensus guidelines: White Paper of the ACR Incidental Findings Committee II on Adnexal Findings. J Am Coll Radiol [id number]:10:[id number]. Electronically Signed   By: Rubye OaksMelanie  Ehinger M.D.   On: 07/06/2017 18:41    Procedures Procedures (including critical care time)  Medications Ordered in ED Medications  sodium chloride 0.9 % bolus 1,000 mL (0 mLs Intravenous Stopped 07/06/17 1753)  ondansetron (ZOFRAN) injection 4 mg (4 mg Intravenous Given 07/06/17 1551)  morphine 4 MG/ML injection 4 mg (4 mg Intravenous Given 07/06/17 1551)  ondansetron (ZOFRAN) injection 4 mg (4 mg Intravenous Given 07/06/17 1903)  morphine 4 MG/ML injection 4 mg (4 mg Intravenous Given 07/06/17 1903)     Initial Impression / Assessment and Plan / ED Course  I have reviewed the triage vital signs and the nursing notes.  Pertinent labs & imaging results that were available during my care of the patient were reviewed by me and considered in my medical decision making (see chart for details).    This  is a 31 y.o. Female who presents to the emergency department complaining of intermittent right upper quadrant and right flank pain ongoing for several months. She reports  she often will have little spurts of pain that then resolved spontaneously. She reports over the past 2 days she's had more constant pain that seems to be worse when she eats. She denies any nausea or vomiting. No urinary symptoms. No history of kidney stones. Only previous abdominal surgical history includes an appendectomy. On exam the patient is afebrile nontoxic appearing. She does have positive Murphy sign. Her abdomen is soft. She also has right flank tenderness to palpation. No lower abdominal tenderness to palpation.   At this time we are waiting on a urinalysis and hepatic function panel. Pregnancy test is negative. BMP is unremarkable. CBC is unremarkable. At this time we'll plan for right upper quadrant ultrasound to look at her gallbladder. Patient agrees with plan.  Right upper quadrant ultrasound shows a 4 mm gallbladder polyp and no biliary dilation. It also shows fatty liver infiltration. Hepatic function panel is unremarkable.  I discussed these test results with the patient. She is still having some pain. I'm concerned that there may be another cause of her pain. Will obtain CT renal stone study to rule out kidney stone.  Urinalysis returns and is nitrite negative  with large leukocytes, few bacteria and no hemoglobin.  CT renal stone study shows mild right hydronephrosis and perinephric edema without obstructing stone. This may reflect a recently passed stone versus polynephritis. She also has nonobstructing calculi in both kidneys. There is also a right adnexal cyst.  I suspect this right hydronephrosis as the cause of her pain today. There is no evidence of any stone. At reevaluation she still having some mild pain. She reports it's better after pain medication. She's had no vomiting. She is able tolerate by mouth.   On further questioning patient does report that she has had some more frequency and urgency to urinate. She did not initially tell me about this. She reports this has  been ongoing for the past 2 days.  She denies dysuria or trouble urinating. Suspect this may be related to pyelonephritis. We'll go ahead and treat for pyelonephritis with her hydronephrosis and perinephric stranding as well as large leukocytes on UA. Urine sent for culture.  Patient agrees with plan. We'll discharge with Keflex, Zofran and naproxen. Patient also given a short course of Norco. Controlled substance database evaluated and patient has no recent prescriptions for controlled substances. I did also advise her of her right adnexal cyst and encourage her to follow-up with ultrasound by her OB in about 6-12 weeks. I also discussed her gallbladder polyp and advised that if she continues to have symptoms especially with eating she may require HIDA scan. General surgery follow-up also provided. Urology follow-up provided. I encouraged close follow-up with primary care. Strict and specific return precautions discussed. I advised the patient to follow-up with their primary care provider this week. I advised the patient to return to the emergency department with new or worsening symptoms or new concerns. The patient verbalized understanding and agreement with plan.    Final Clinical Impressions(s) / ED Diagnoses   Final diagnoses:  Pyelonephritis  Right flank pain  Gallbladder polyp  Right ovarian cyst  Nausea    New Prescriptions Discharge Medication List as of 07/06/2017  7:06 PM    START taking these medications   Details  cephALEXin (KEFLEX) 500 MG capsule Take 1 capsule (500 mg total) by mouth 4 (four) times daily., Starting Wed 07/06/2017, Print    HYDROcodone-acetaminophen (NORCO/VICODIN) 5-325 MG tablet Take 1-2 tablets by mouth every 6 (six) hours as needed for moderate pain or severe pain., Starting Wed 07/06/2017, Print    naproxen (NAPROSYN) 250 MG tablet Take 1 tablet (250 mg total) by mouth 2 (two) times daily with a meal., Starting Wed 07/06/2017, Print    ondansetron (ZOFRAN  ODT) 4 MG disintegrating tablet Take 1 tablet (4 mg total) by mouth every 8 (eight) hours as needed for nausea or vomiting., Starting Wed 07/06/2017, Print         Everlene Farrier, PA-C 07/06/17 2134    Arby Barrette, MD 07/15/17 [phone]

## 2017-07-06 NOTE — ED Triage Notes (Signed)
Pt endorses right flank pain x 2 days. Denies urinary sx. 1 episode of diarrhea. VSS. Afebrile.

## 2017-07-06 NOTE — ED Notes (Signed)
Patient transported to CT 

## 2017-07-06 NOTE — ED Notes (Signed)
Patient transported to Ultrasound 

## 2017-07-08 LAB — URINE CULTURE

## 2017-09-14 ENCOUNTER — Ambulatory Visit: Payer: 59 | Admitting: Podiatry

## 2017-09-16 ENCOUNTER — Telehealth: Payer: Self-pay | Admitting: *Deleted

## 2017-09-16 NOTE — Telephone Encounter (Signed)
Refill request for terbinafine #90. Pt has received 90 therapeutic doses. Return refill denied.

## 2018-04-05 ENCOUNTER — Other Ambulatory Visit: Payer: Self-pay | Admitting: Obstetrics & Gynecology

## 2018-04-20 ENCOUNTER — Encounter (HOSPITAL_BASED_OUTPATIENT_CLINIC_OR_DEPARTMENT_OTHER): Payer: Self-pay | Admitting: *Deleted

## 2018-04-20 ENCOUNTER — Other Ambulatory Visit: Payer: Self-pay

## 2018-04-20 NOTE — Progress Notes (Signed)
Spoke w/ pt via phone for pre-op interview.  Npo after mn.  Arrive at 0530.  Needs cbc and urine preg.  Will take nexium am dos w/ sips of water and if needed take tylenol.

## 2018-04-24 ENCOUNTER — Encounter (HOSPITAL_BASED_OUTPATIENT_CLINIC_OR_DEPARTMENT_OTHER)
Admission: RE | Disposition: A | Discharge status: Home / Self Care | Payer: Self-pay | Source: Ambulatory Visit | Attending: Obstetrics & Gynecology

## 2018-04-24 ENCOUNTER — Ambulatory Visit (HOSPITAL_BASED_OUTPATIENT_CLINIC_OR_DEPARTMENT_OTHER)
Admission: RE | Admit: 2018-04-24 | Discharge: 2018-04-24 | Disposition: A | Discharge status: Home / Self Care | Payer: 59 | Source: Ambulatory Visit | Attending: Obstetrics & Gynecology | Admitting: Obstetrics & Gynecology

## 2018-04-24 ENCOUNTER — Encounter (HOSPITAL_BASED_OUTPATIENT_CLINIC_OR_DEPARTMENT_OTHER): Payer: Self-pay | Admitting: *Deleted

## 2018-04-24 ENCOUNTER — Ambulatory Visit (HOSPITAL_BASED_OUTPATIENT_CLINIC_OR_DEPARTMENT_OTHER): Payer: 59 | Admitting: Anesthesiology

## 2018-04-24 DIAGNOSIS — N946 Dysmenorrhea, unspecified: Secondary | ICD-10-CM | POA: Insufficient documentation

## 2018-04-24 DIAGNOSIS — Z91040 Latex allergy status: Secondary | ICD-10-CM | POA: Insufficient documentation

## 2018-04-24 DIAGNOSIS — F17210 Nicotine dependence, cigarettes, uncomplicated: Secondary | ICD-10-CM | POA: Diagnosis not present

## 2018-04-24 DIAGNOSIS — Z79899 Other long term (current) drug therapy: Secondary | ICD-10-CM | POA: Diagnosis not present

## 2018-04-24 DIAGNOSIS — N83201 Unspecified ovarian cyst, right side: Secondary | ICD-10-CM | POA: Diagnosis not present

## 2018-04-24 DIAGNOSIS — N803 Endometriosis of pelvic peritoneum: Secondary | ICD-10-CM | POA: Diagnosis not present

## 2018-04-24 DIAGNOSIS — K219 Gastro-esophageal reflux disease without esophagitis: Secondary | ICD-10-CM | POA: Insufficient documentation

## 2018-04-24 DIAGNOSIS — N83202 Unspecified ovarian cyst, left side: Secondary | ICD-10-CM | POA: Diagnosis not present

## 2018-04-24 DIAGNOSIS — R102 Pelvic and perineal pain: Secondary | ICD-10-CM | POA: Diagnosis not present

## 2018-04-24 HISTORY — DX: Personal history of urinary calculi: Z87.442

## 2018-04-24 HISTORY — DX: Pelvic and perineal pain: R10.2

## 2018-04-24 HISTORY — DX: Unspecified ovarian cyst, right side: N83.201

## 2018-04-24 HISTORY — DX: Calculus of kidney: N20.0

## 2018-04-24 HISTORY — PX: LAPAROSCOPIC OVARIAN CYSTECTOMY: SHX6248

## 2018-04-24 HISTORY — DX: Gastro-esophageal reflux disease without esophagitis: K21.9

## 2018-04-24 HISTORY — DX: Presence of spectacles and contact lenses: Z97.3

## 2018-04-24 LAB — CBC
HEMATOCRIT: 39.4 % (ref 36.0–46.0)
HEMOGLOBIN: 12.8 g/dL (ref 12.0–15.0)
MCH: 31.4 pg (ref 26.0–34.0)
MCHC: 32.5 g/dL (ref 30.0–36.0)
MCV: 96.8 fL (ref 78.0–100.0)
Platelets: 266 10*3/uL (ref 150–400)
RBC: 4.07 MIL/uL (ref 3.87–5.11)
RDW: 13.3 % (ref 11.5–15.5)
WBC: 9.2 10*3/uL (ref 4.0–10.5)

## 2018-04-24 LAB — POCT PREGNANCY, URINE: Preg Test, Ur: NEGATIVE

## 2018-04-24 SURGERY — EXCISION, CYST, OVARY, LAPAROSCOPIC
Anesthesia: General | Site: Abdomen | Laterality: Right

## 2018-04-24 MED ORDER — MIDAZOLAM HCL 5 MG/5ML IJ SOLN
INTRAMUSCULAR | Status: DC | PRN
  Administered 2018-04-24: 2 mg via INTRAVENOUS

## 2018-04-24 MED ORDER — METOCLOPRAMIDE HCL 5 MG/ML IJ SOLN
10.0000 mg | Freq: Once | INTRAMUSCULAR | Status: AC | PRN
  Administered 2018-04-24: 10 mg via INTRAVENOUS
  Filled 2018-04-24: qty 2

## 2018-04-24 MED ORDER — MEPERIDINE HCL 25 MG/ML IJ SOLN
6.2500 mg | INTRAMUSCULAR | Status: DC | PRN
  Filled 2018-04-24: qty 1

## 2018-04-24 MED ORDER — PROMETHAZINE HCL 25 MG/ML IJ SOLN
6.2500 mg | INTRAMUSCULAR | Status: DC | PRN
  Administered 2018-04-24: 6.25 mg via INTRAVENOUS
  Filled 2018-04-24: qty 1

## 2018-04-24 MED ORDER — ROCURONIUM BROMIDE 100 MG/10ML IV SOLN
INTRAVENOUS | Status: DC | PRN
  Administered 2018-04-24: 20 mg via INTRAVENOUS
  Administered 2018-04-24: 10 mg via INTRAVENOUS
  Administered 2018-04-24: 40 mg via INTRAVENOUS

## 2018-04-24 MED ORDER — FENTANYL CITRATE (PF) 100 MCG/2ML IJ SOLN
INTRAMUSCULAR | Status: DC | PRN
  Administered 2018-04-24: 50 ug via INTRAVENOUS
  Administered 2018-04-24: 100 ug via INTRAVENOUS
  Administered 2018-04-24 (×2): 50 ug via INTRAVENOUS
  Administered 2018-04-24: 50 ug

## 2018-04-24 MED ORDER — SCOPOLAMINE 1 MG/3DAYS TD PT72
MEDICATED_PATCH | TRANSDERMAL | Status: DC | PRN
  Administered 2018-04-24: 1 via TRANSDERMAL

## 2018-04-24 MED ORDER — FENTANYL CITRATE (PF) 100 MCG/2ML IJ SOLN
INTRAMUSCULAR | Status: AC
  Filled 2018-04-24: qty 2

## 2018-04-24 MED ORDER — ONDANSETRON HCL 4 MG/2ML IJ SOLN
INTRAMUSCULAR | Status: DC | PRN
  Administered 2018-04-24: 4 mg via INTRAVENOUS

## 2018-04-24 MED ORDER — LIDOCAINE 2% (20 MG/ML) 5 ML SYRINGE
INTRAMUSCULAR | Status: AC
  Filled 2018-04-24: qty 5

## 2018-04-24 MED ORDER — LACTATED RINGERS IV SOLN
INTRAVENOUS | Status: DC
  Administered 2018-04-24 (×2): via INTRAVENOUS
  Filled 2018-04-24: qty 1000

## 2018-04-24 MED ORDER — PROMETHAZINE HCL 25 MG/ML IJ SOLN
INTRAMUSCULAR | Status: AC
  Filled 2018-04-24: qty 1

## 2018-04-24 MED ORDER — LIDOCAINE HCL (CARDIAC) PF 100 MG/5ML IV SOSY
PREFILLED_SYRINGE | INTRAVENOUS | Status: DC | PRN
  Administered 2018-04-24: 100 mg via INTRAVENOUS

## 2018-04-24 MED ORDER — MIDAZOLAM HCL 2 MG/2ML IJ SOLN
INTRAMUSCULAR | Status: AC
  Filled 2018-04-24: qty 2

## 2018-04-24 MED ORDER — ROCURONIUM BROMIDE 10 MG/ML (PF) SYRINGE
PREFILLED_SYRINGE | INTRAVENOUS | Status: AC
  Filled 2018-04-24: qty 5

## 2018-04-24 MED ORDER — SUGAMMADEX SODIUM 200 MG/2ML IV SOLN
INTRAVENOUS | Status: DC | PRN
  Administered 2018-04-24: 160 mg via INTRAVENOUS

## 2018-04-24 MED ORDER — SCOPOLAMINE 1 MG/3DAYS TD PT72
MEDICATED_PATCH | TRANSDERMAL | Status: AC
  Filled 2018-04-24: qty 1

## 2018-04-24 MED ORDER — SODIUM CHLORIDE 0.9 % IR SOLN
Status: DC | PRN
  Administered 2018-04-24: 3000 mL

## 2018-04-24 MED ORDER — DEXAMETHASONE SODIUM PHOSPHATE 4 MG/ML IJ SOLN
INTRAMUSCULAR | Status: DC | PRN
  Administered 2018-04-24: 10 mg via INTRAVENOUS

## 2018-04-24 MED ORDER — KETOROLAC TROMETHAMINE 30 MG/ML IJ SOLN
INTRAMUSCULAR | Status: AC
  Filled 2018-04-24: qty 1

## 2018-04-24 MED ORDER — PROPOFOL 10 MG/ML IV BOLUS
INTRAVENOUS | Status: DC | PRN
  Administered 2018-04-24: 150 mg via INTRAVENOUS

## 2018-04-24 MED ORDER — FENTANYL CITRATE (PF) 100 MCG/2ML IJ SOLN
25.0000 ug | INTRAMUSCULAR | Status: DC | PRN
  Administered 2018-04-24: 25 ug via INTRAVENOUS
  Filled 2018-04-24: qty 1

## 2018-04-24 MED ORDER — DEXAMETHASONE SODIUM PHOSPHATE 10 MG/ML IJ SOLN
INTRAMUSCULAR | Status: AC
  Filled 2018-04-24: qty 1

## 2018-04-24 MED ORDER — SUGAMMADEX SODIUM 200 MG/2ML IV SOLN
INTRAVENOUS | Status: AC
  Filled 2018-04-24: qty 2

## 2018-04-24 MED ORDER — KETOROLAC TROMETHAMINE 30 MG/ML IJ SOLN
INTRAMUSCULAR | Status: DC | PRN
  Administered 2018-04-24: 30 mg via INTRAVENOUS

## 2018-04-24 MED ORDER — BUPIVACAINE HCL (PF) 0.25 % IJ SOLN
INTRAMUSCULAR | Status: DC | PRN
  Administered 2018-04-24: 20 mL

## 2018-04-24 MED ORDER — OXYCODONE-ACETAMINOPHEN 5-325 MG PO TABS: 1.0000 | ORAL_TABLET | Freq: Four times a day (QID) | ORAL | 0 refills | Status: AC | PRN

## 2018-04-24 MED ORDER — METOCLOPRAMIDE HCL 5 MG/ML IJ SOLN
INTRAMUSCULAR | Status: AC
  Filled 2018-04-24: qty 2

## 2018-04-24 MED ORDER — FENTANYL CITRATE (PF) 250 MCG/5ML IJ SOLN
INTRAMUSCULAR | Status: AC
  Filled 2018-04-24: qty 5

## 2018-04-24 MED ORDER — PROPOFOL 10 MG/ML IV BOLUS
INTRAVENOUS | Status: AC
  Filled 2018-04-24: qty 40

## 2018-04-24 MED ORDER — LACTATED RINGERS IV SOLN
INTRAVENOUS | Status: DC
  Filled 2018-04-24: qty 1000

## 2018-04-24 MED ORDER — ONDANSETRON HCL 4 MG/2ML IJ SOLN
INTRAMUSCULAR | Status: AC
  Filled 2018-04-24: qty 2

## 2018-04-24 SURGICAL SUPPLY — 28 items
APPLICATOR ARISTA FLEXITIP XL (MISCELLANEOUS) ×3 IMPLANT
CABLE HIGH FREQUENCY MONO STRZ (ELECTRODE) ×3 IMPLANT
COVER MAYO STAND STRL (DRAPES) ×3 IMPLANT
DERMABOND ADVANCED (GAUZE/BANDAGES/DRESSINGS) ×2
DERMABOND ADVANCED .7 DNX12 (GAUZE/BANDAGES/DRESSINGS) ×1 IMPLANT
DRSG OPSITE POSTOP 3X4 (GAUZE/BANDAGES/DRESSINGS) ×3 IMPLANT
DURAPREP 26ML APPLICATOR (WOUND CARE) ×3 IMPLANT
FILTER SMOKE EVAC LAPAROSHD (FILTER) ×3 IMPLANT
GLOVE BIOGEL PI IND STRL 7.0 (GLOVE) ×3 IMPLANT
GLOVE BIOGEL PI INDICATOR 7.0 (GLOVE) ×6
GOWN STRL REUS W/TWL LRG LVL3 (GOWN DISPOSABLE) ×9 IMPLANT
HEMOSTAT ARISTA ABSORB 3G PWDR (MISCELLANEOUS) ×3 IMPLANT
HEMOSTAT SURGICEL 2X14 (HEMOSTASIS) ×3 IMPLANT
IV NS IRRIG 3000ML ARTHROMATIC (IV SOLUTION) ×3 IMPLANT
LIGASURE VESSEL 5MM BLUNT TIP (ELECTROSURGICAL) IMPLANT
PACK TRENDGUARD 450 HYBRID PRO (MISCELLANEOUS) ×1 IMPLANT
POUCH SPECIMEN RETRIEVAL 10MM (ENDOMECHANICALS) IMPLANT
SCISSORS LAP 5X35 DISP (ENDOMECHANICALS) IMPLANT
SET IRRIG TUBING LAPAROSCOPIC (IRRIGATION / IRRIGATOR) ×3 IMPLANT
SUT VICRYL 0 UR6 27IN ABS (SUTURE) ×3 IMPLANT
SUT VICRYL 4-0 PS2 18IN ABS (SUTURE) ×3 IMPLANT
TOWEL OR 17X24 6PK STRL BLUE (TOWEL DISPOSABLE) ×6 IMPLANT
TRAY LAPAROSCOPIC (CUSTOM PROCEDURE TRAY) ×3 IMPLANT
TRENDGUARD 450 HYBRID PRO PACK (MISCELLANEOUS) ×3
TROCAR BALLN 12MMX100 BLUNT (TROCAR) ×3 IMPLANT
TROCAR XCEL NON-BLD 5MMX100MML (ENDOMECHANICALS) ×6 IMPLANT
WARMER LAPAROSCOPE (MISCELLANEOUS) ×3 IMPLANT
WATER STERILE IRR 500ML POUR (IV SOLUTION) ×3 IMPLANT

## 2018-04-24 NOTE — H&P (Signed)
Emily Gallegos is an 32 y.o. female G1P1001 with worsening dysmenorrhea. Pelvic sono noted right ovarian complex cyst with internal echoes possibly endometriosis.  No recent Paps, no breast complaints.   Patient's last menstrual period was 04/20/2018 (exact date).    Past Medical History:  Diagnosis Date  . GERD (gastroesophageal reflux disease)   . History of kidney stones   . Nephrolithiasis    per CT 07-06-2017  bilateral ron-obstructive renal calculi  . Pelvic pain in female   . Right ovarian cyst   . Wears glasses     Past Surgical History:  Procedure Laterality Date  . APPENDECTOMY  age 32    History reviewed. No pertinent family history.  Social History:  reports that she has been smoking cigarettes.  She has a 5.00 pack-year smoking history. She has never used smokeless tobacco. She reports that she drinks alcohol. She reports that she does not use drugs.  Allergies:  Allergies  Allergen Reactions  . Latex Itching and Rash    Medications Prior to Admission  Medication Sig Dispense Refill Last Dose  . acetaminophen (TYLENOL) 500 MG tablet Take 500 mg by mouth every 6 (six) hours as needed.   Past Week at Unknown time  . esomeprazole (NEXIUM) 20 MG capsule Take 20 mg by mouth as needed.   04/23/2018 at Unknown time  . ibuprofen (ADVIL,MOTRIN) 200 MG tablet Take 200 mg by mouth every 6 (six) hours as needed.   Past Month at Unknown time    ROS neg  Blood pressure 108/77, pulse 63, temperature 98.4 F (36.9 C), temperature source Oral, resp. rate 16, height 5\' 3"  (1.6 m), weight 173 lb 1.6 oz (78.5 kg), last menstrual period 04/20/2018, SpO2 100 %. Physical Exam Physical exam:  A&O x 3, no acute distress. Pleasant HEENT neg, no thyromegaly Lungs CTA bilat CV RRR, S1S2 normal Abdo soft, non tender, non acute Extr no edema/ tenderness Pelvic uterus slightly enlarged, right adnexal tenderness  No results found for this or any previous visit (from the past 24  hour(s)).  No results found.  Assessment/Plan: Pelvic pain, worsening dysmenorrhea, here for laparoscopy, right ovarian cystectomy, possible right salpingoophorectomy and ablation of endometriosis  Risks/complications of surgery reviewed incl infection, bleeding, damage to internal organs including bladder, bowels, ureters, blood vessels, other risks from anesthesia, VTE and delayed complications of any surgery, complications in future surgery reviewed.  Robley FriesVaishali R Jaelene Garciagarcia 04/24/2018, 6:17 AM

## 2018-04-24 NOTE — Discharge Instructions (Signed)
Endometriosis Endometriosis is a condition in which the tissue that lines the uterus (endometrium) grows outside of its normal location. The tissue may grow in many locations close to the uterus, but it commonly grows on the ovaries, fallopian tubes, vagina, or bowel. When the uterus sheds the endometrium every menstrual cycle, there is bleeding wherever the endometrial tissue is located. This can cause pain because blood is irritating to tissues that are not normally exposed to it. What are the causes? The cause of endometriosis is not known. What increases the risk? You may be more likely to develop endometriosis if you:  Have a family history of endometriosis.  Have never given birth.  Started your period at age 28 or younger.  Have high levels of estrogen in your body.  Were exposed to a certain medicine (diethylstilbestrol) before you were born (in utero).  Had low birth weight.  Were born as a twin, triplet, or other multiple.  Have a BMI of less than 25. BMI is an estimate of body fat and is calculated from height and weight.  What are the signs or symptoms? Often, there are no symptoms of this condition. If you do have symptoms, they may:  Vary depending on where your endometrial tissue is growing.  Occur during your menstrual period (most common) or midcycle.  Come and go, or you may go months with no symptoms at all.  Stop with menopause.  Symptoms may include:  Pain in the back or abdomen.  Heavier bleeding during periods.  Pain during sex.  Painful bowel movements.  Infertility.  Pelvic pain.  Bleeding more than once a month.  How is this diagnosed? This condition is diagnosed based on your symptoms and a physical exam. You may have tests, such as:  Blood tests and urine tests. These may be done to help rule out other possible causes of your symptoms.  Ultrasound, to look for abnormal tissues.  An X-ray of the lower bowel (barium enema).  An  ultrasound that is done through the vagina (transvaginally).  CT scan.  MRI.  Laparoscopy. In this procedure, a lighted, pencil-sized instrument called a laparoscope is inserted into your abdomen through an incision. The laparoscope allows your health care provider to look at the organs inside your body and check for abnormal tissue to confirm the diagnosis. If abnormal tissue is found, your health care provider may remove a small piece of tissue (biopsy) to be examined under a microscope.  How is this treated? Treatment for this condition may include:  Medicines to relieve pain, such as NSAIDs.  Hormone therapy. This involves using artificial (synthetic) hormones to reduce endometrial tissue growth. Your health care provider may recommend using a hormonal form of birth control, or other medicines.  Surgery. This may be done to remove abnormal endometrial tissue. ? In some cases, tissue may be removed using a laparoscope and a laser (laparoscopic laser treatment). ? In severe cases, surgery may be done to remove the fallopian tubes, uterus, and ovaries (hysterectomy).  Follow these instructions at home:  Take over-the-counter and prescription medicines only as told by your health care provider.  Do not drive or use heavy machinery while taking prescription pain medicine.  Try to avoid activities that cause pain, including sexual activity.  Keep all follow-up visits as told by your health care provider. This is important. Contact a health care provider if:  You have pain in the area between your hip bones (pelvic area) that occurs: ? Before, during, or  after your period. ? In between your period and gets worse during your period. ? During or after sex. ? With bowel movements or urination, especially during your period.  You have problems getting pregnant.  You have a fever. Get help right away if:  You have severe pain that does not get better with medicine.  You have severe  nausea and vomiting, or you cannot eat without vomiting.  You have pain that affects only the lower, right side of your abdomen.  You have abdominal pain that gets worse.  You have abdominal swelling.  You have blood in your stool. This information is not intended to replace advice given to you by your health care provider. Make sure you discuss any questions you have with your health care provider. Document Released: 11/05/2000 Document Revised: 08/13/2016 Document Reviewed: 04/10/2016 Elsevier Interactive Patient Education  2018 [employer]     Post Anesthesia Home Care Instructions  Activity: Get plenty of rest for the remainder of the day. A responsible individual must stay with you for 24 hours following the procedure.  For the next 24 hours, DO NOT: -Drive a car -Advertising copywriterperate machinery -Drink alcoholic beverages -Take any medication unless instructed by your physician -Make any legal decisions or sign important papers.  Meals: Start with liquid foods such as gelatin or soup. Progress to regular foods as tolerated. Avoid greasy, spicy, heavy foods. If nausea and/or vomiting occur, drink only clear liquids until the nausea and/or vomiting subsides. Call your physician if vomiting continues.  Special Instructions/Symptoms: Your throat may feel dry or sore from the anesthesia or the breathing tube placed in your throat during surgery. If this causes discomfort, gargle with warm salt water. The discomfort should disappear within 24 hours.  If you had a scopolamine patch placed behind your ear for the management of post- operative nausea and/or vomiting:  1. The medication in the patch is effective for 72 hours, after which it should be removed.  Wrap patch in a tissue and discard in the trash. Wash hands thoroughly with soap and water. 2. You may remove the patch earlier than 72 hours if you experience unpleasant side effects which may include dry mouth, dizziness or visual  disturbances. 3. Avoid touching the patch. Wash your hands with soap and water after contact with the patch.   .Marland Kitchen

## 2018-04-24 NOTE — Anesthesia Procedure Notes (Signed)
Procedure Name: Intubation Date/Time: 04/24/2018 7:43 AM Performed by: Bonney Aid, CRNA Pre-anesthesia Checklist: Patient identified, Emergency Drugs available, Suction available and Patient being monitored Patient Re-evaluated:Patient Re-evaluated prior to induction Oxygen Delivery Method: Circle system utilized Preoxygenation: Pre-oxygenation with 100% oxygen Induction Type: IV induction Ventilation: Mask ventilation without difficulty Laryngoscope Size: Mac and 3 Grade View: Grade I Tube type: Oral Tube size: 7.0 mm Number of attempts: 1 Airway Equipment and Method: Stylet and Oral airway Placement Confirmation: ETT inserted through vocal cords under direct vision,  positive ETCO2 and breath sounds checked- equal and bilateral Secured at: 21 cm Tube secured with: Tape Dental Injury: Teeth and Oropharynx as per pre-operative assessment

## 2018-04-24 NOTE — Transfer of Care (Signed)
Immediate Anesthesia Transfer of Care Note  Patient: Emily Gallegos  Procedure(s) Performed: LAPAROSCOPY with LYSIS OF ADHESIONS, DRAINAGE BILATERAL ENDOMETRIOMA, ABLATION ENDOMETRIOSIS (Right Abdomen)  Patient Location: PACU  Anesthesia Type:General  Level of Consciousness: awake and oriented  Airway & Oxygen Therapy: Patient Spontanous Breathing and Patient connected to nasal cannula oxygen  Post-op Assessment: Report given to RN  Post vital signs: Reviewed and stable  Last Vitals:  Vitals Value Taken Time  BP 126/87 04/24/2018  9:31 AM  Temp 36.8 C 04/24/2018  9:31 AM  Pulse 82 04/24/2018  9:36 AM  Resp 18 04/24/2018  9:36 AM  SpO2 100 % 04/24/2018  9:36 AM  Vitals shown include unvalidated device data.  Last Pain:  Vitals:   04/24/18 0547  TempSrc: Oral         Complications: No apparent anesthesia complications

## 2018-04-24 NOTE — Op Note (Signed)
Preoperative diagnosis: Pelvic pain, dysmenorrhea, right ovarian cyst Postoperative diagnosis: Same, pelvic endometriosis, pelvic adhesions, bilateral endometriomas, sigmoid adhesions Procedure: Operative Laparoscopy, drainage of bilateral endometriomas, lysis of adhesions, ablation of endometriosis  Surgeon: Dr Shea EvansVaishali Carlo Lorson, MD Assistants: Marlinda Mikeanya Bailey, CNM Anesthesia Gen. Endotracheal IV fluids LR 1000 cc EBL minimal 10 cc Urine clear in foley Complications none Disposition PACU and home Specimens none  Procedure Patient presents for operative laparoscopy.   Risk and complications of surgery including infection, bleeding, damage to internal organs, other complications including pneumonia, VTE were reviewed.  Patient voiced understanding. Informed written consent was obtained and patient was brought to the operating room with IV running. Timeout was carried out. She underwent general anesthesia without difficulty and was given dorsal lithotomy position. Pelvic examination under anesthesia was normal. She was prepped and draped in standard fashion. Bladder was emptied with straight catheter. Speculum was placed anterior lip of cervix was grasped with tenaculum, uterus was sounded to 7 cm . Acorn manipulator was inserted and secured to tenaculum. Gloves, gown were changed attention was focused on the abdomen.  A  10 mm vertical incision was made in the lower edge of the umbilicus. Incision was carried down to the fascia was incised and peritoneal entry made. Hassan canula introduced and secured with balloon. Insufflation was begun with CO2 and a 0 laparoscope was introduced. No bleeding noted at entry site. Trendelenburg given. Pelvic adhesions and evidence of endometriosis noted, so decision made to proceed with operative laparoscopy.   Two 5 mm trocar was inserted in lower quadrant each under vision after injecting marcaine.  Right lower omental adhesions excised with cold scissors from right  uterine and right round ligament area.  Both ovaries were enlarged with cysts, sigmoid colon was densely adherent to posterior lower uterus, uterosacral area. Both ovarian cysts were drained and copiousendometrioma fluid irrigated. Ovarian adhesions to posterior uterine walls and cul de sac released with blunt and sharp dissection, bleeding cauterized. Bowel adhesion was dense and extended to obliterate cul de sac in midline but laterally cul de sac was seen well after ovarian adhesions released. Bilateral ureters were seen well. After irrigation, Arista sprays over both the ovaries and then Interceed place on the right ovary and under it. Hemostasis noted. All instruments removed after instilling remaining 8 cc of 0.25% Marcaine in the peritoneal cavity, pneumoperitoneum was released. Fascial incision closed with single stitch of 0-Vicryl. The skin approximated using 4-0 Vicryl in subcuticular fashion. Dermabond was applied. The uterine manipulator, tenaculum removed. Hemostasis was excellent.  All counts were correct x 2.  Patient brought out to the recovery room after extubation in stable condition. Plan is to discharge home from recovery room. Surgical findings were discussed with patient's family.  Followup with Dr. Juliene PinaMody in office in 2 weeks.

## 2018-04-24 NOTE — Anesthesia Postprocedure Evaluation (Signed)
Anesthesia Post Note  Patient: Emily Gallegos  Procedure(s) Performed: LAPAROSCOPY with LYSIS OF ADHESIONS, DRAINAGE BILATERAL ENDOMETRIOMA, ABLATION ENDOMETRIOSIS (Right Abdomen)     Patient location during evaluation: PACU Anesthesia Type: General Level of consciousness: awake and alert Pain management: pain level controlled Vital Signs Assessment: post-procedure vital signs reviewed and stable Respiratory status: spontaneous breathing, nonlabored ventilation, respiratory function stable and patient connected to nasal cannula oxygen Cardiovascular status: blood pressure returned to baseline and stable Postop Assessment: no apparent nausea or vomiting Anesthetic complications: no    Last Vitals:  Vitals:   04/24/18 1003 04/24/18 1015  BP:  117/70  Pulse: (!) 53 79  Resp: 14 18  Temp:    SpO2: 97% 93%    Last Pain:  Vitals:   04/24/18 1100  TempSrc:   PainSc: 0-No pain                 Phillips Groutarignan, [place] Boccio

## 2018-04-24 NOTE — Anesthesia Preprocedure Evaluation (Addendum)
Anesthesia Evaluation  Patient identified by MRN, date of birth, ID band Patient awake    Reviewed: Allergy & Precautions, NPO status , Patient's Chart, lab work & pertinent test results  Airway Mallampati: II  TM Distance: >3 FB Neck ROM: Full    Dental no notable dental hx.    Pulmonary Current Smoker,    Pulmonary exam normal breath sounds clear to auscultation       Cardiovascular negative cardio ROS Normal cardiovascular exam Rhythm:Regular Rate:Normal     Neuro/Psych negative neurological ROS  negative psych ROS   GI/Hepatic Neg liver ROS, GERD  Medicated and Controlled,  Endo/Other  negative endocrine ROS  Renal/GU negative Renal ROS  negative genitourinary   Musculoskeletal negative musculoskeletal ROS (+)   Abdominal   Peds negative pediatric ROS (+)  Hematology negative hematology ROS (+)   Anesthesia Other Findings   Reproductive/Obstetrics negative OB ROS                             Anesthesia Physical Anesthesia Plan  ASA: II  Anesthesia Plan: General   Post-op Pain Management:    Induction: Intravenous  PONV Risk Score and Plan: 3 and Ondansetron, Dexamethasone, Midazolam, Scopolamine patch - Pre-op and Treatment may vary due to age or medical condition  Airway Management Planned: Oral ETT  Additional Equipment:   Intra-op Plan:   Post-operative Plan: Extubation in OR  Informed Consent: I have reviewed the patients History and Physical, chart, labs and discussed the procedure including the risks, benefits and alternatives for the proposed anesthesia with the patient or authorized representative who has indicated his/her understanding and acceptance.   Dental advisory given  Plan Discussed with: CRNA  Anesthesia Plan Comments:         Anesthesia Quick Evaluation

## 2018-04-25 ENCOUNTER — Encounter (HOSPITAL_BASED_OUTPATIENT_CLINIC_OR_DEPARTMENT_OTHER): Payer: Self-pay | Admitting: Obstetrics & Gynecology

## 2018-09-24 IMAGING — US US ABDOMEN LIMITED
1 series · 14 of 25 positions shown · non-contrast
Comparison: No recent.

CLINICAL DATA: Right upper quadrant pain.

EXAM:
ULTRASOUND ABDOMEN LIMITED RIGHT UPPER QUADRANT

[Series 1: us abdomen limited · 0.22mm/px · 14 of 41 slices shown]
[im 1/41]
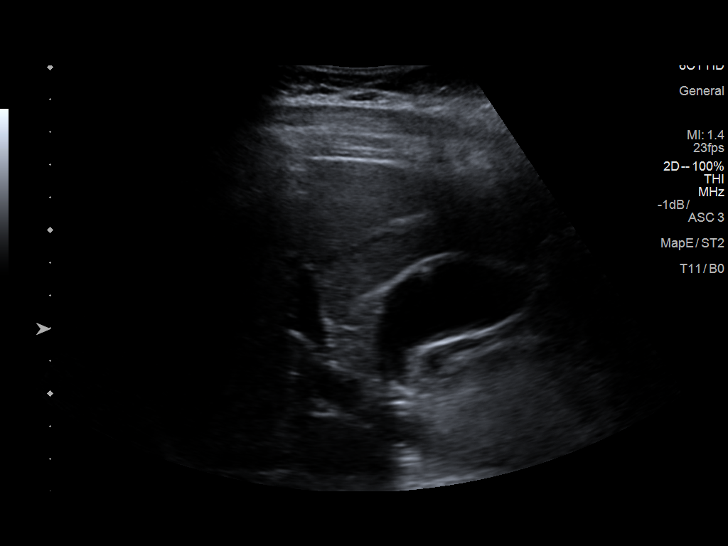
[im 4/41]
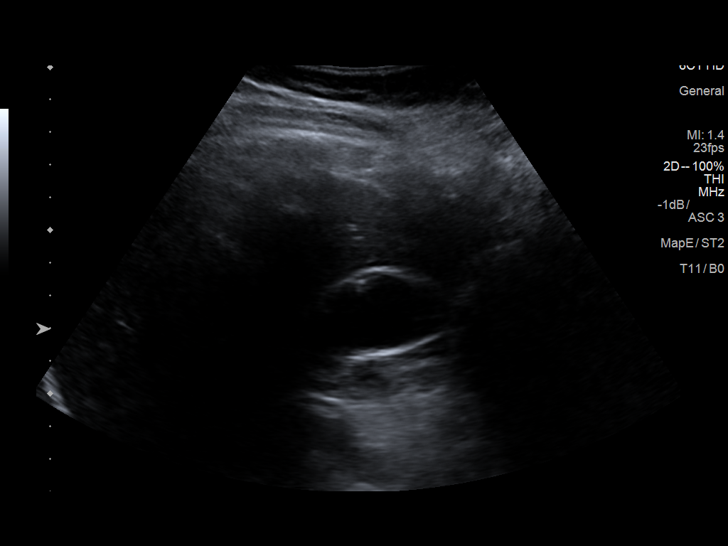
[im 7/41]
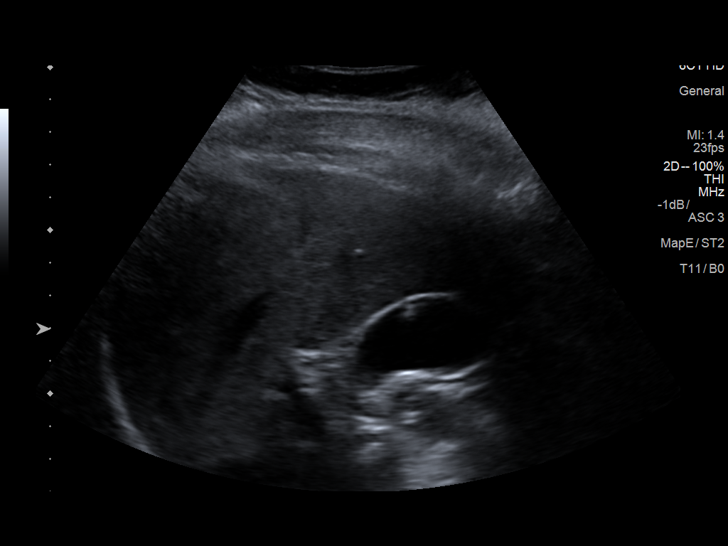
[im 11/41]
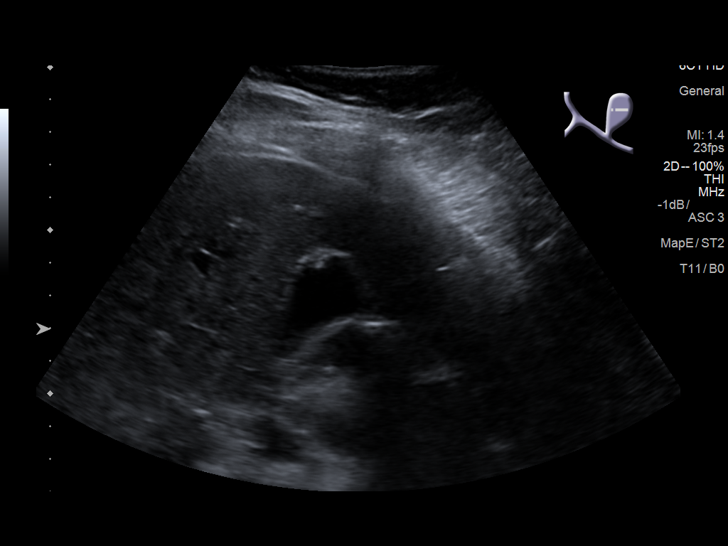
[im 14/41]
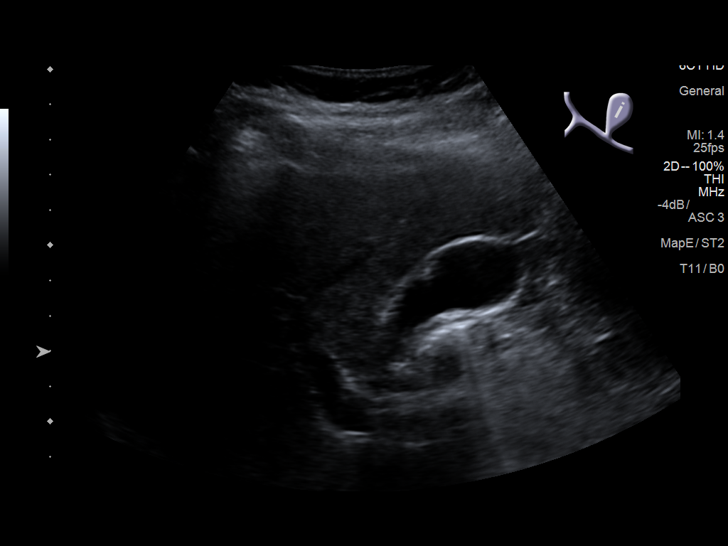
[im 16/41]
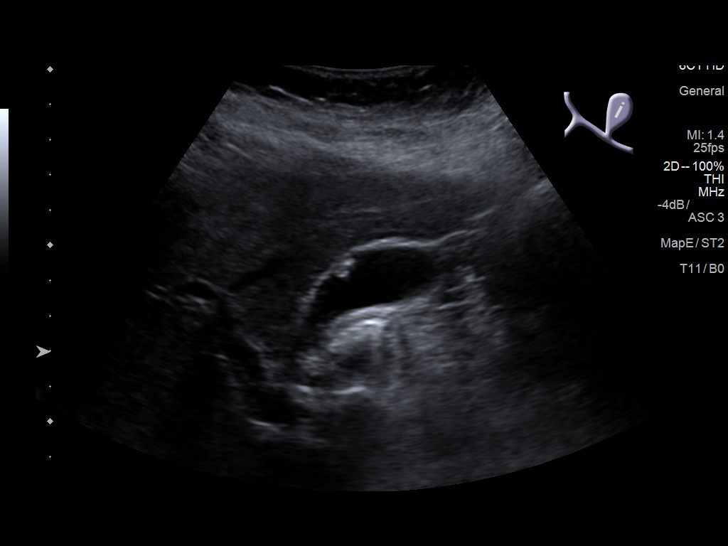
[im 19/41]
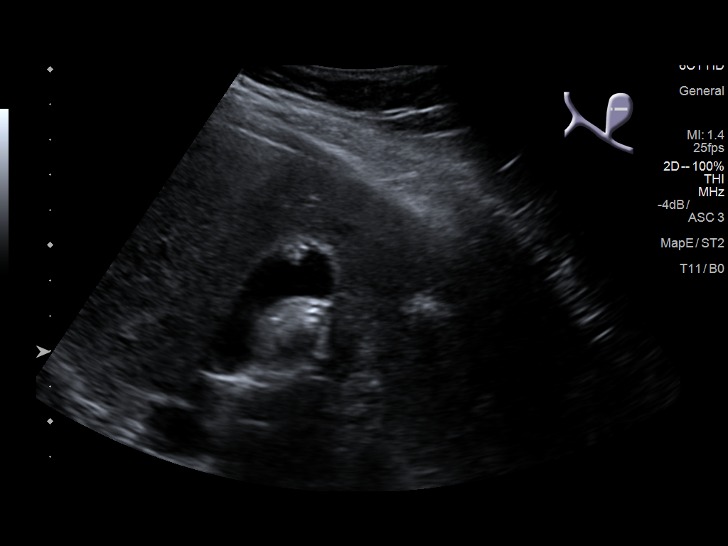
[im 22/41]
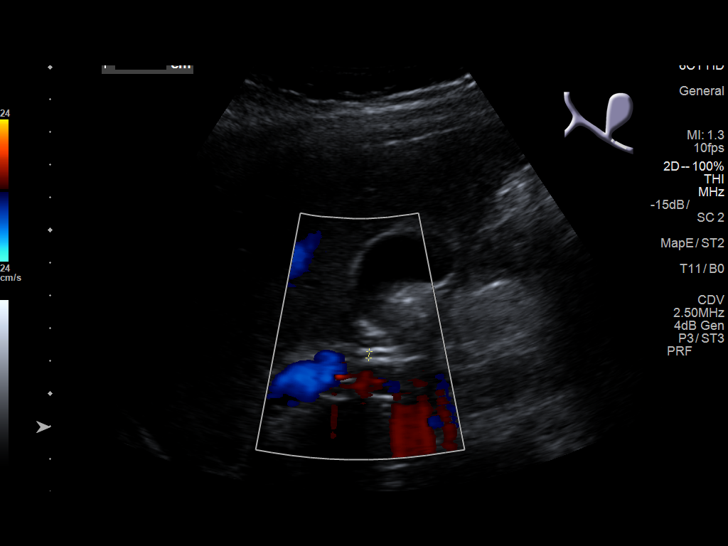
[im 26/41]
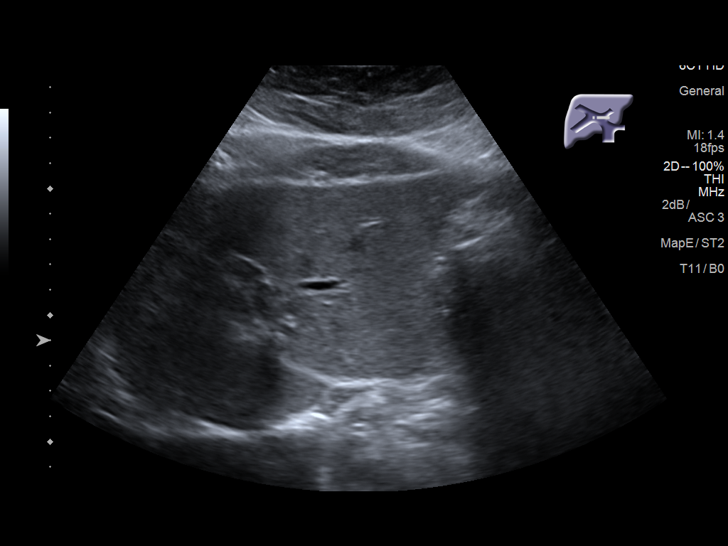
[im 27/41]
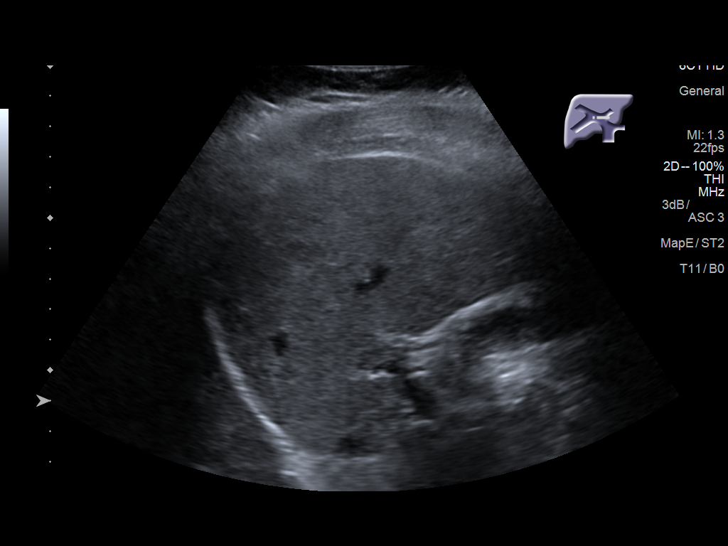
[im 31/41]
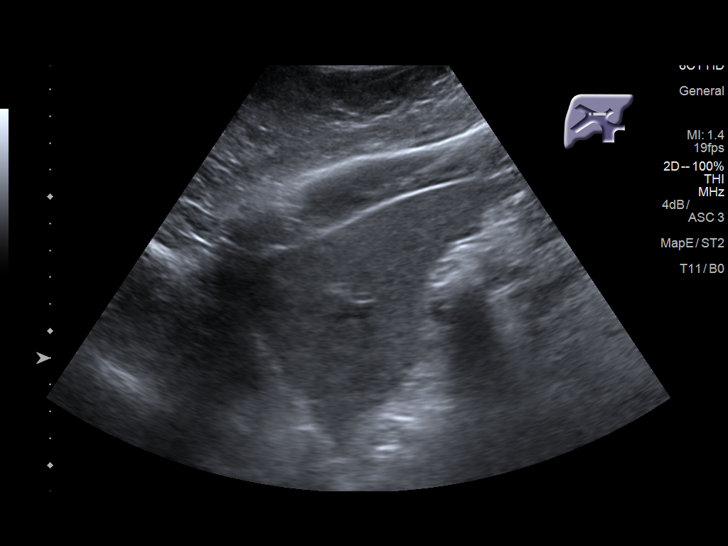
[im 34/41]
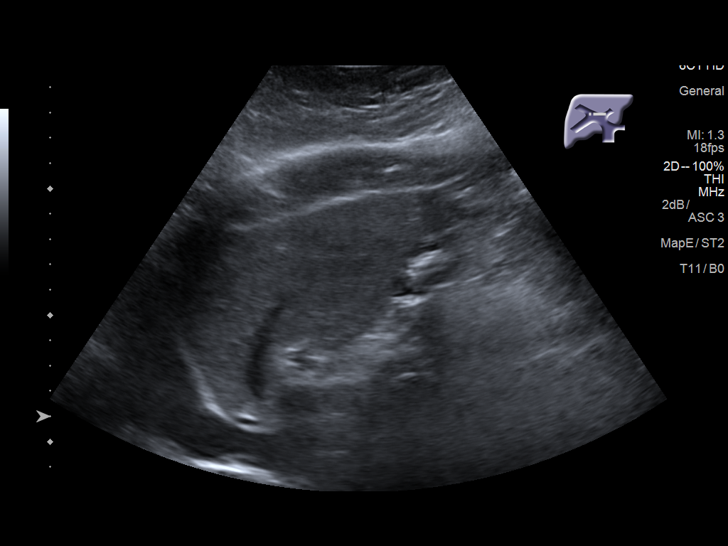
[im 37/41]
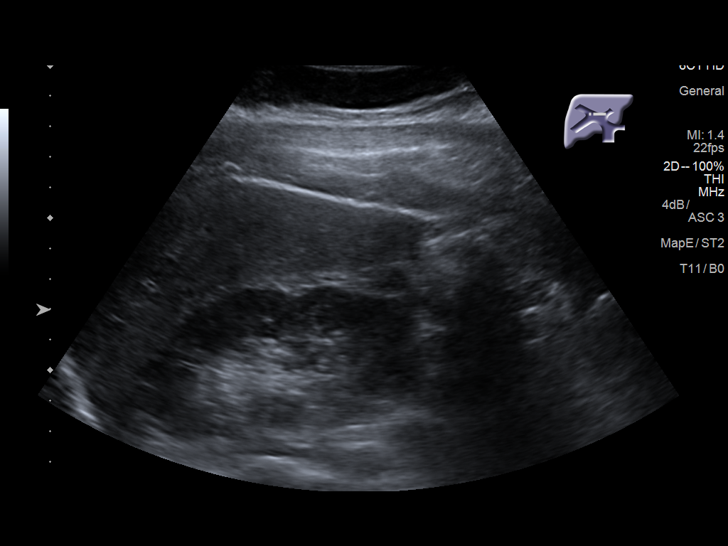
[im 41/41]
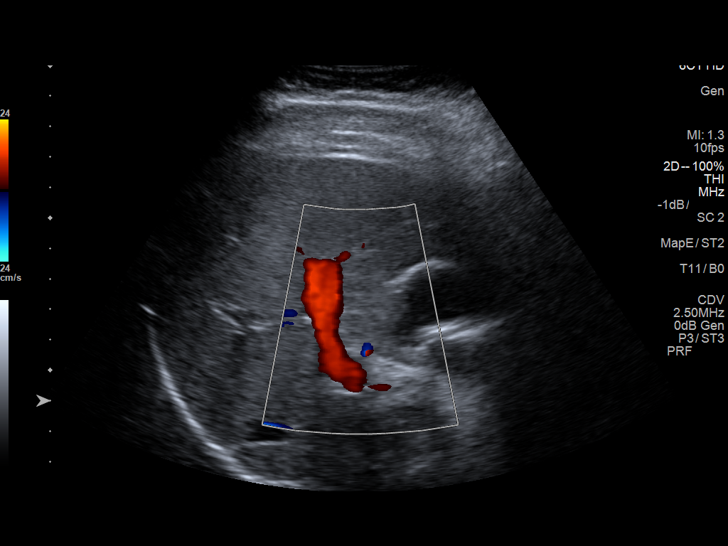

[14 of 25 positions shown; findings below may reference images not displayed]

FINDINGS: Gallbladder:

4 mm polyp.  No sonographic Murphy sign noted by sonographer.

Common bile duct:

Diameter: 2.0 mm

Liver:

Increase echogenicity consistent with fatty infiltration and/or
hepatocellular disease. No focal hepatic abnormality identified.
IMPRESSION: 1. 4 mm gallbladder polyp.  No biliary distention.

2. Increased hepatic echogenicity consistent fatty infiltration
and/or hepatocellular disease. No focal hepatic abnormality
identified .
